# Patient Record
Sex: Male | Born: 1970 | Race: White | Hispanic: No | Marital: Single | Smoking: Current every day smoker
Health system: Southern US, Community
[De-identification: ages and names within clinical notes are randomized; demographics above are authoritative.]

## PROBLEM LIST (undated history)

## (undated) DIAGNOSIS — Z720 Tobacco use: Secondary | ICD-10-CM

## (undated) DIAGNOSIS — N200 Calculus of kidney: Secondary | ICD-10-CM

## (undated) DIAGNOSIS — G43909 Migraine, unspecified, not intractable, without status migrainosus: Secondary | ICD-10-CM

## (undated) HISTORY — DX: Migraine, unspecified, not intractable, without status migrainosus: G43.909

## (undated) HISTORY — DX: Tobacco use: Z72.0

---

## 1998-11-11 ENCOUNTER — Emergency Department (HOSPITAL_COMMUNITY): Admission: EM | Admit: 1998-11-11 | Discharge: 1998-11-11 | Payer: Self-pay | Admitting: Emergency Medicine

## 1998-11-11 ENCOUNTER — Encounter: Payer: Self-pay | Admitting: Emergency Medicine

## 1999-10-04 ENCOUNTER — Encounter: Payer: Self-pay | Admitting: Emergency Medicine

## 1999-10-04 ENCOUNTER — Emergency Department (HOSPITAL_COMMUNITY): Admission: EM | Admit: 1999-10-04 | Discharge: 1999-10-04 | Payer: Self-pay | Admitting: Emergency Medicine

## 2004-02-02 ENCOUNTER — Emergency Department (HOSPITAL_COMMUNITY): Admission: EM | Admit: 2004-02-02 | Discharge: 2004-02-02 | Payer: Self-pay | Admitting: Family Medicine

## 2004-06-08 ENCOUNTER — Emergency Department (HOSPITAL_COMMUNITY): Admission: AD | Admit: 2004-06-08 | Discharge: 2004-06-08 | Payer: Self-pay | Admitting: Family Medicine

## 2004-10-18 ENCOUNTER — Emergency Department (HOSPITAL_COMMUNITY): Admission: EM | Admit: 2004-10-18 | Discharge: 2004-10-18 | Payer: Self-pay | Admitting: Family Medicine

## 2004-10-19 ENCOUNTER — Emergency Department (HOSPITAL_COMMUNITY): Admission: EM | Admit: 2004-10-19 | Discharge: 2004-10-19 | Payer: Self-pay | Admitting: Family Medicine

## 2005-03-12 ENCOUNTER — Emergency Department (HOSPITAL_COMMUNITY): Admission: EM | Admit: 2005-03-12 | Discharge: 2005-03-12 | Payer: Self-pay | Admitting: Family Medicine

## 2005-12-06 ENCOUNTER — Emergency Department (HOSPITAL_COMMUNITY): Admission: EM | Admit: 2005-12-06 | Discharge: 2005-12-06 | Payer: Self-pay | Admitting: Family Medicine

## 2006-03-05 ENCOUNTER — Emergency Department (HOSPITAL_COMMUNITY): Admission: EM | Admit: 2006-03-05 | Discharge: 2006-03-05 | Payer: Self-pay | Admitting: Family Medicine

## 2006-03-28 IMAGING — CR DG FOOT COMPLETE 3+V*L*
3 series · 3 of 3 positions shown · non-contrast
Comparison: none

CLINICAL DATA: Plantar left foot pain for one week with no known injury.

LEFT FOOT - 3 VIEW

[view not recorded (1 of 3)]
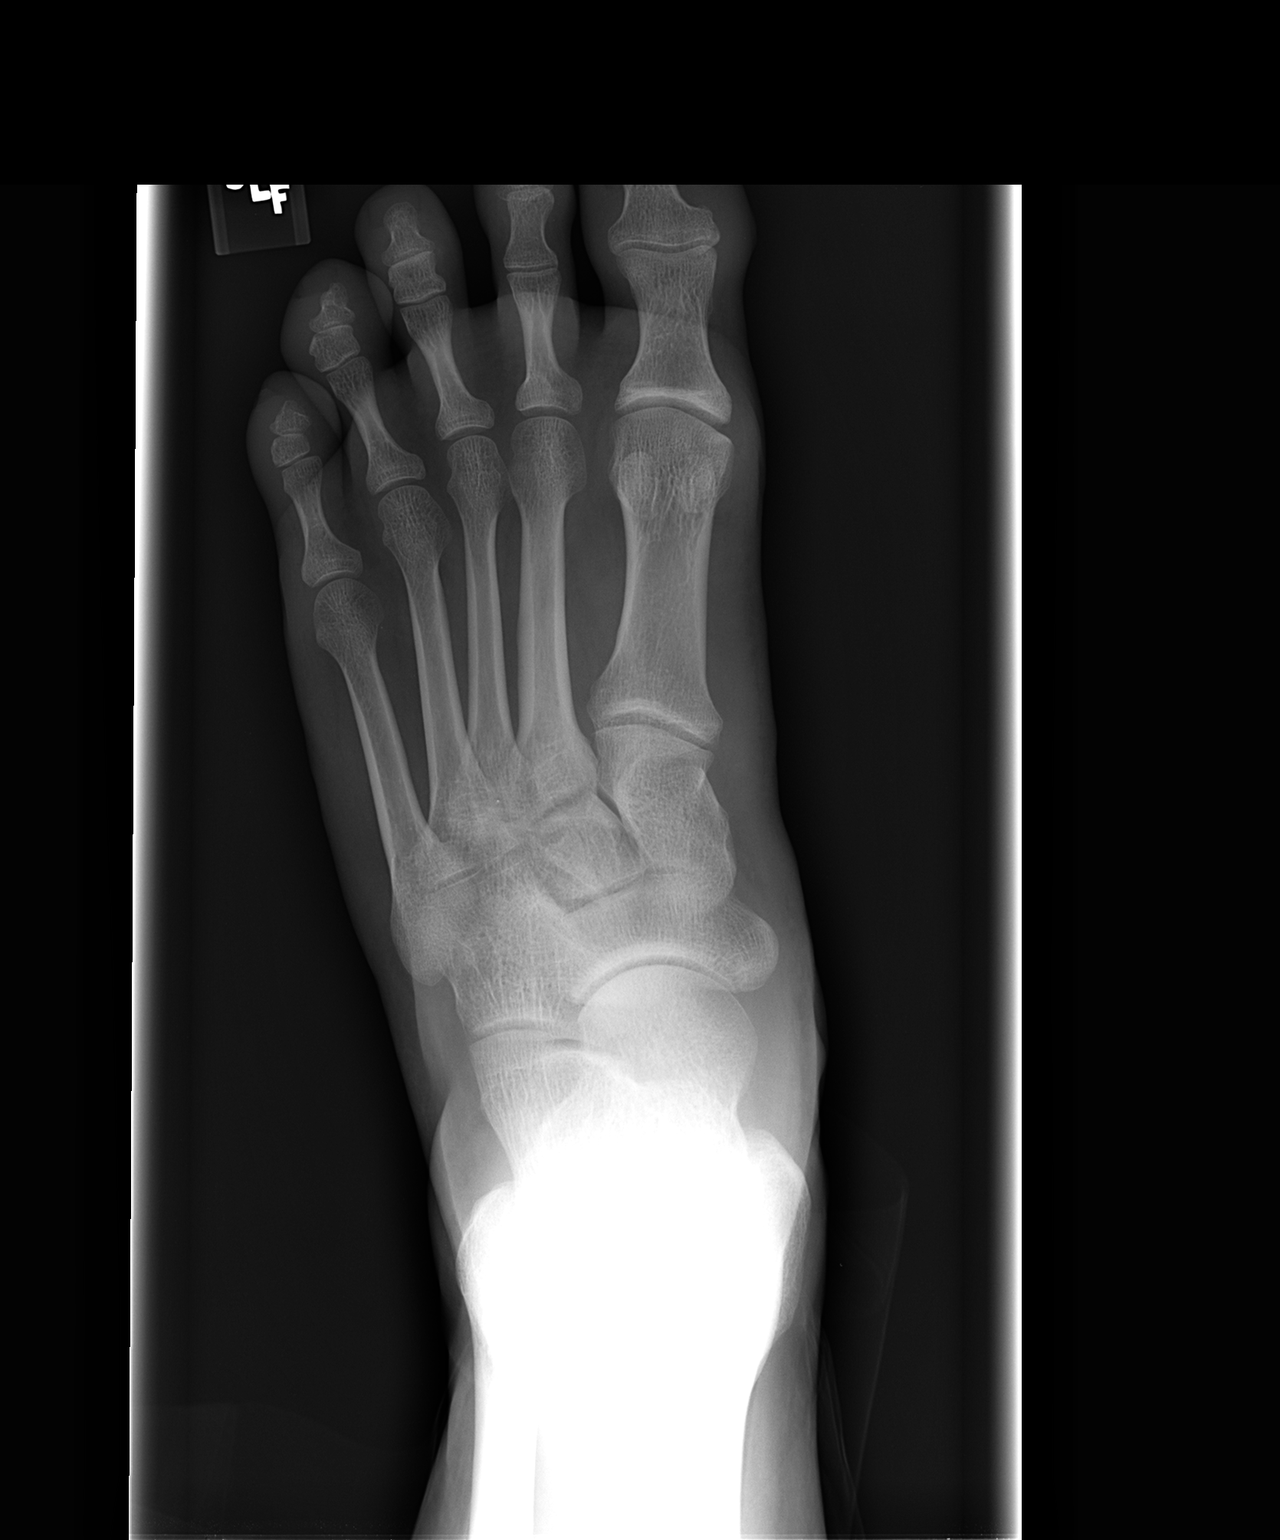

[view not recorded (2 of 3)]
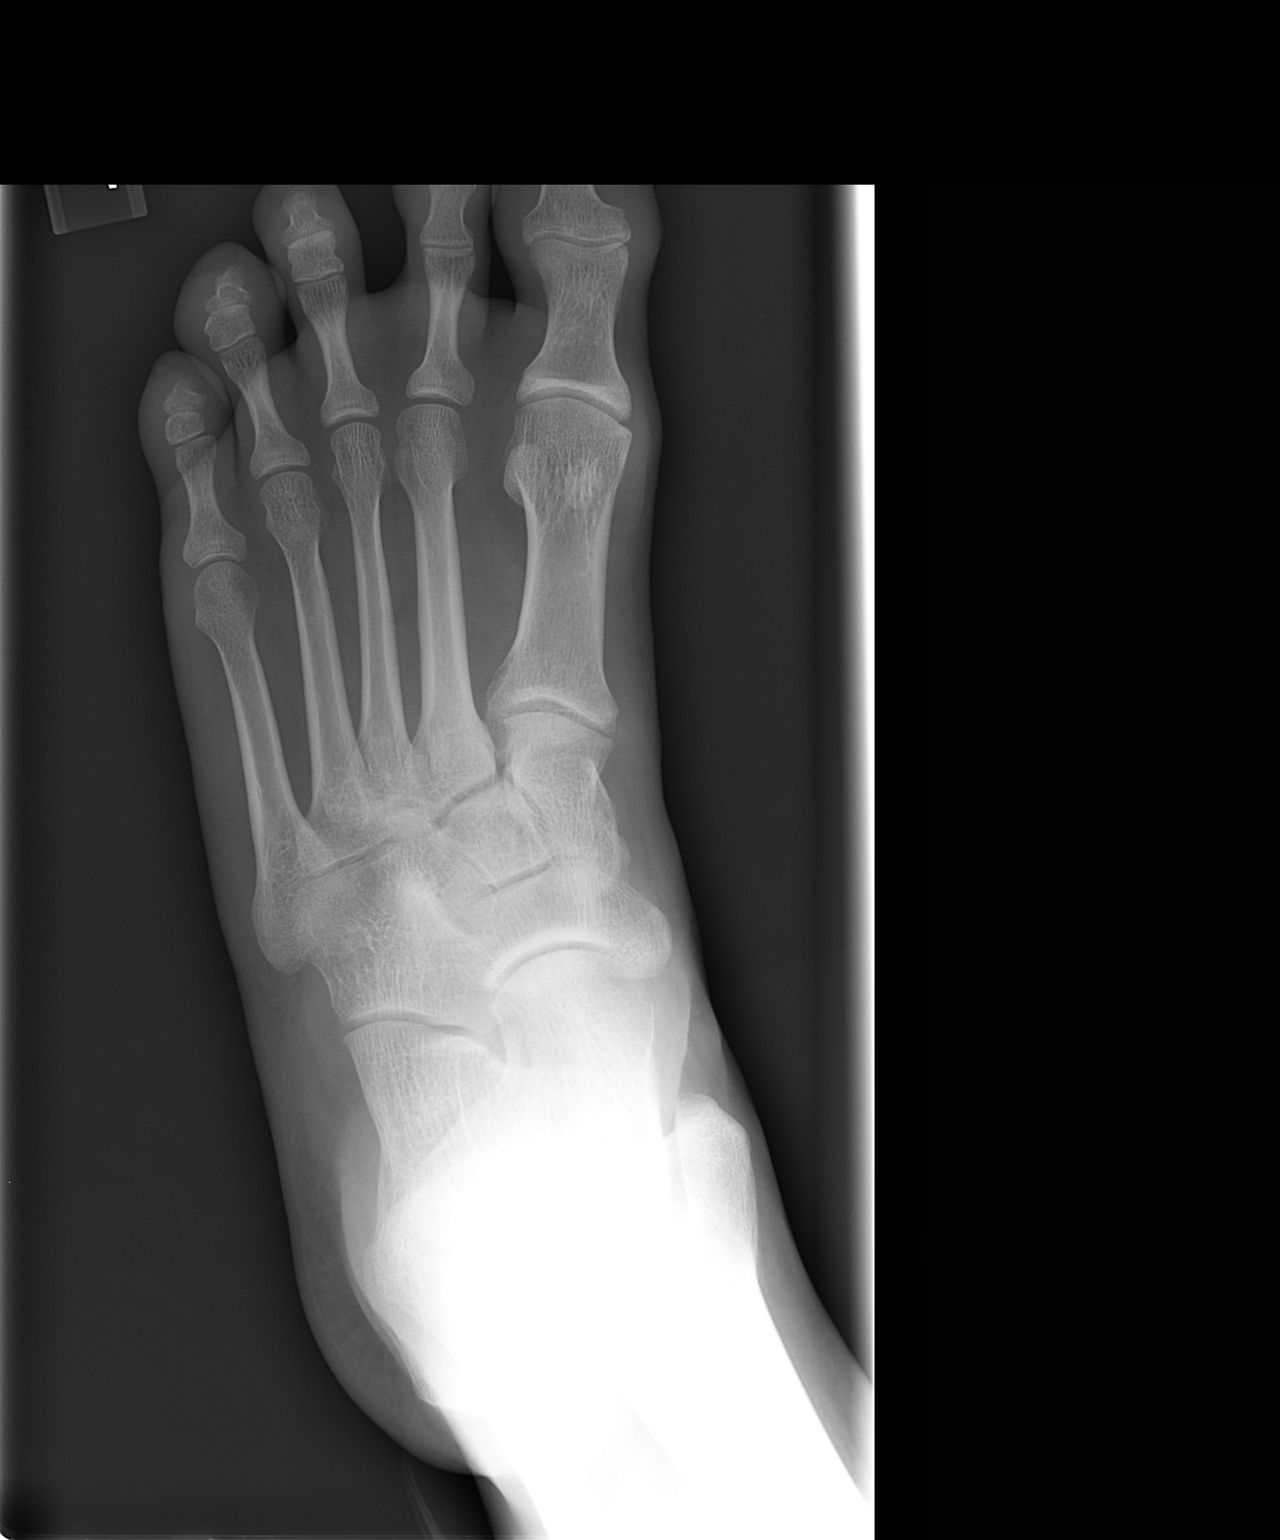

[view not recorded (3 of 3)]
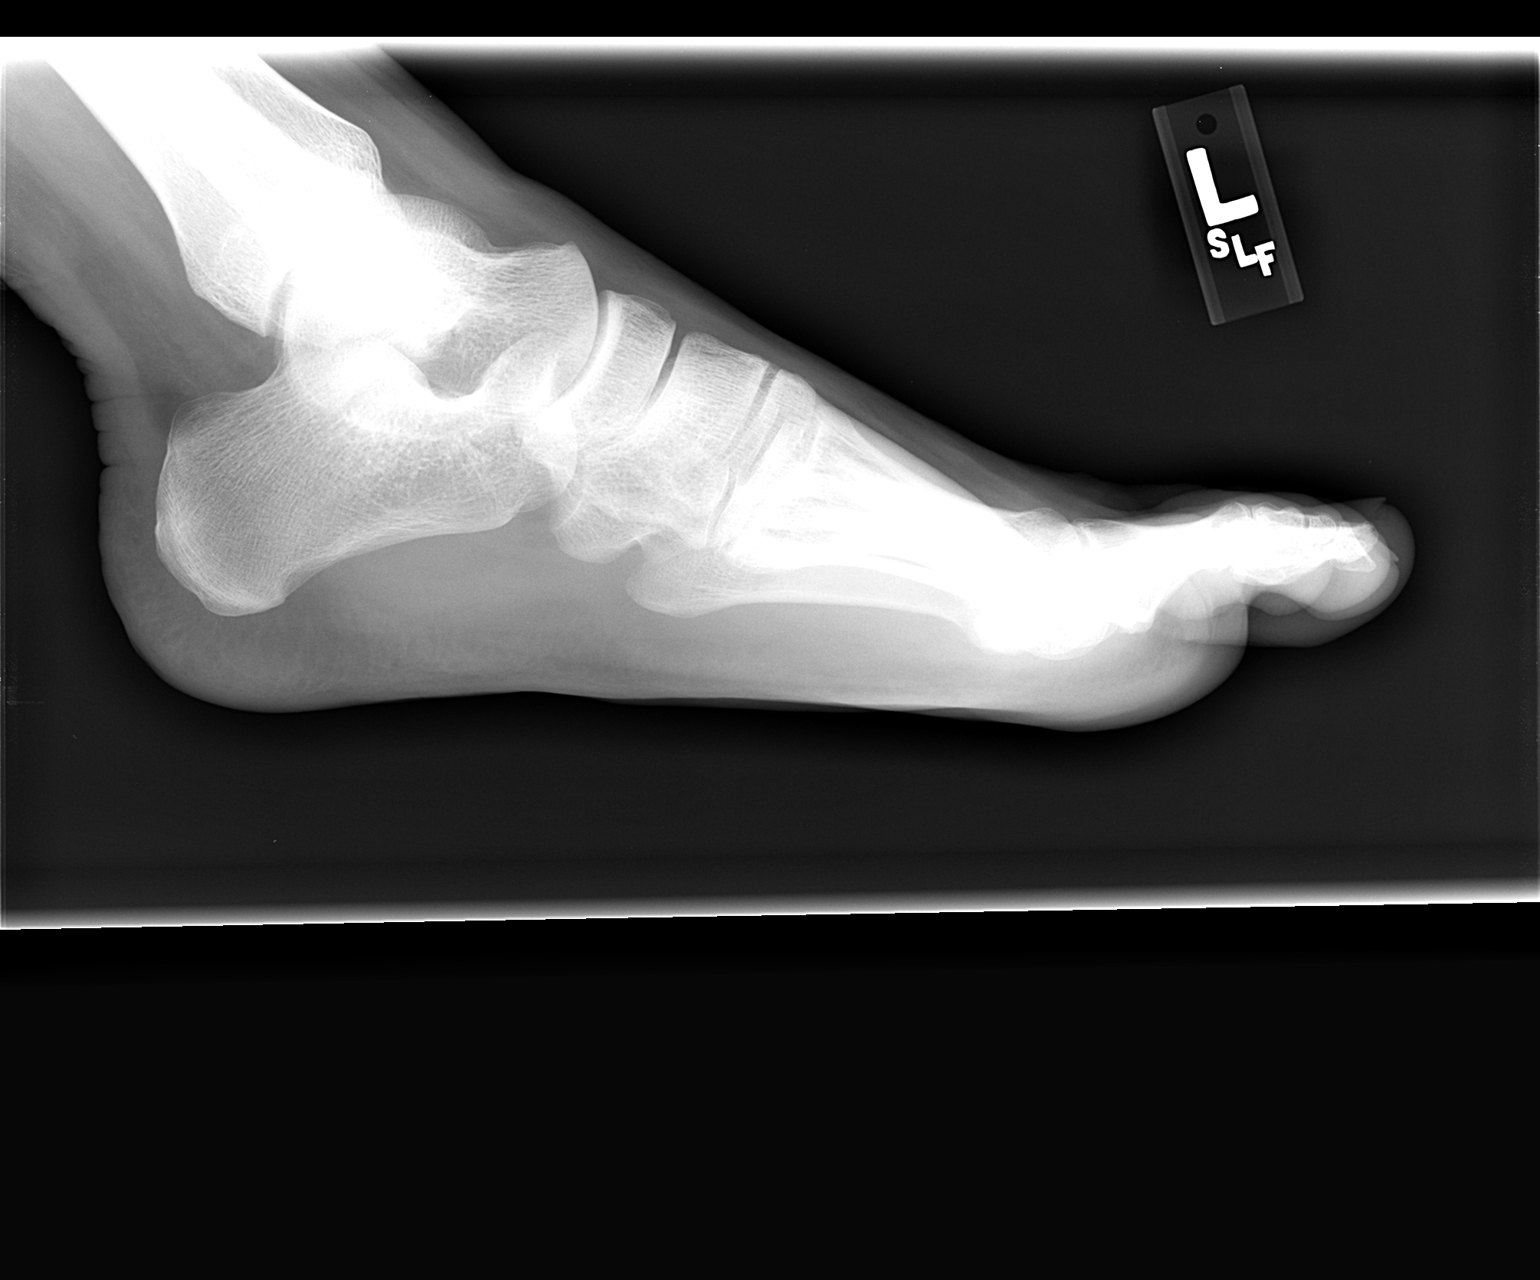

[3 of 3 positions shown; findings below may reference images not displayed]

FINDINGS: Normal appearing bones and soft tissues.

IMPRESSION

Normal examination.

## 2006-06-09 ENCOUNTER — Encounter: Payer: Self-pay | Admitting: Internal Medicine

## 2006-06-09 ENCOUNTER — Ambulatory Visit: Payer: Self-pay | Admitting: Internal Medicine

## 2006-06-09 DIAGNOSIS — L0293 Carbuncle, unspecified: Secondary | ICD-10-CM

## 2006-06-09 DIAGNOSIS — I10 Essential (primary) hypertension: Secondary | ICD-10-CM | POA: Insufficient documentation

## 2006-06-09 DIAGNOSIS — L0292 Furuncle, unspecified: Secondary | ICD-10-CM | POA: Insufficient documentation

## 2006-06-09 DIAGNOSIS — J45909 Unspecified asthma, uncomplicated: Secondary | ICD-10-CM | POA: Insufficient documentation

## 2006-07-21 ENCOUNTER — Encounter: Payer: Self-pay | Admitting: Internal Medicine

## 2006-07-21 ENCOUNTER — Ambulatory Visit: Payer: Self-pay | Admitting: Internal Medicine

## 2006-07-21 DIAGNOSIS — G2581 Restless legs syndrome: Secondary | ICD-10-CM | POA: Insufficient documentation

## 2006-08-12 ENCOUNTER — Emergency Department (HOSPITAL_COMMUNITY): Admission: EM | Admit: 2006-08-12 | Discharge: 2006-08-12 | Payer: Self-pay | Admitting: Family Medicine

## 2007-03-02 ENCOUNTER — Emergency Department (HOSPITAL_COMMUNITY): Admission: EM | Admit: 2007-03-02 | Discharge: 2007-03-02 | Payer: Self-pay | Admitting: Emergency Medicine

## 2007-12-16 ENCOUNTER — Emergency Department (HOSPITAL_COMMUNITY): Admission: EM | Admit: 2007-12-16 | Discharge: 2007-12-16 | Payer: Self-pay | Admitting: Family Medicine

## 2007-12-23 IMAGING — CR DG ABDOMEN 1V
2 series · 2 of 2 positions shown · non-contrast
Comparison: none

CLINICAL DATA: Left flank pain for a day.
 ABDOMEN, ONE VIEW:

[view not recorded (1 of 2)]
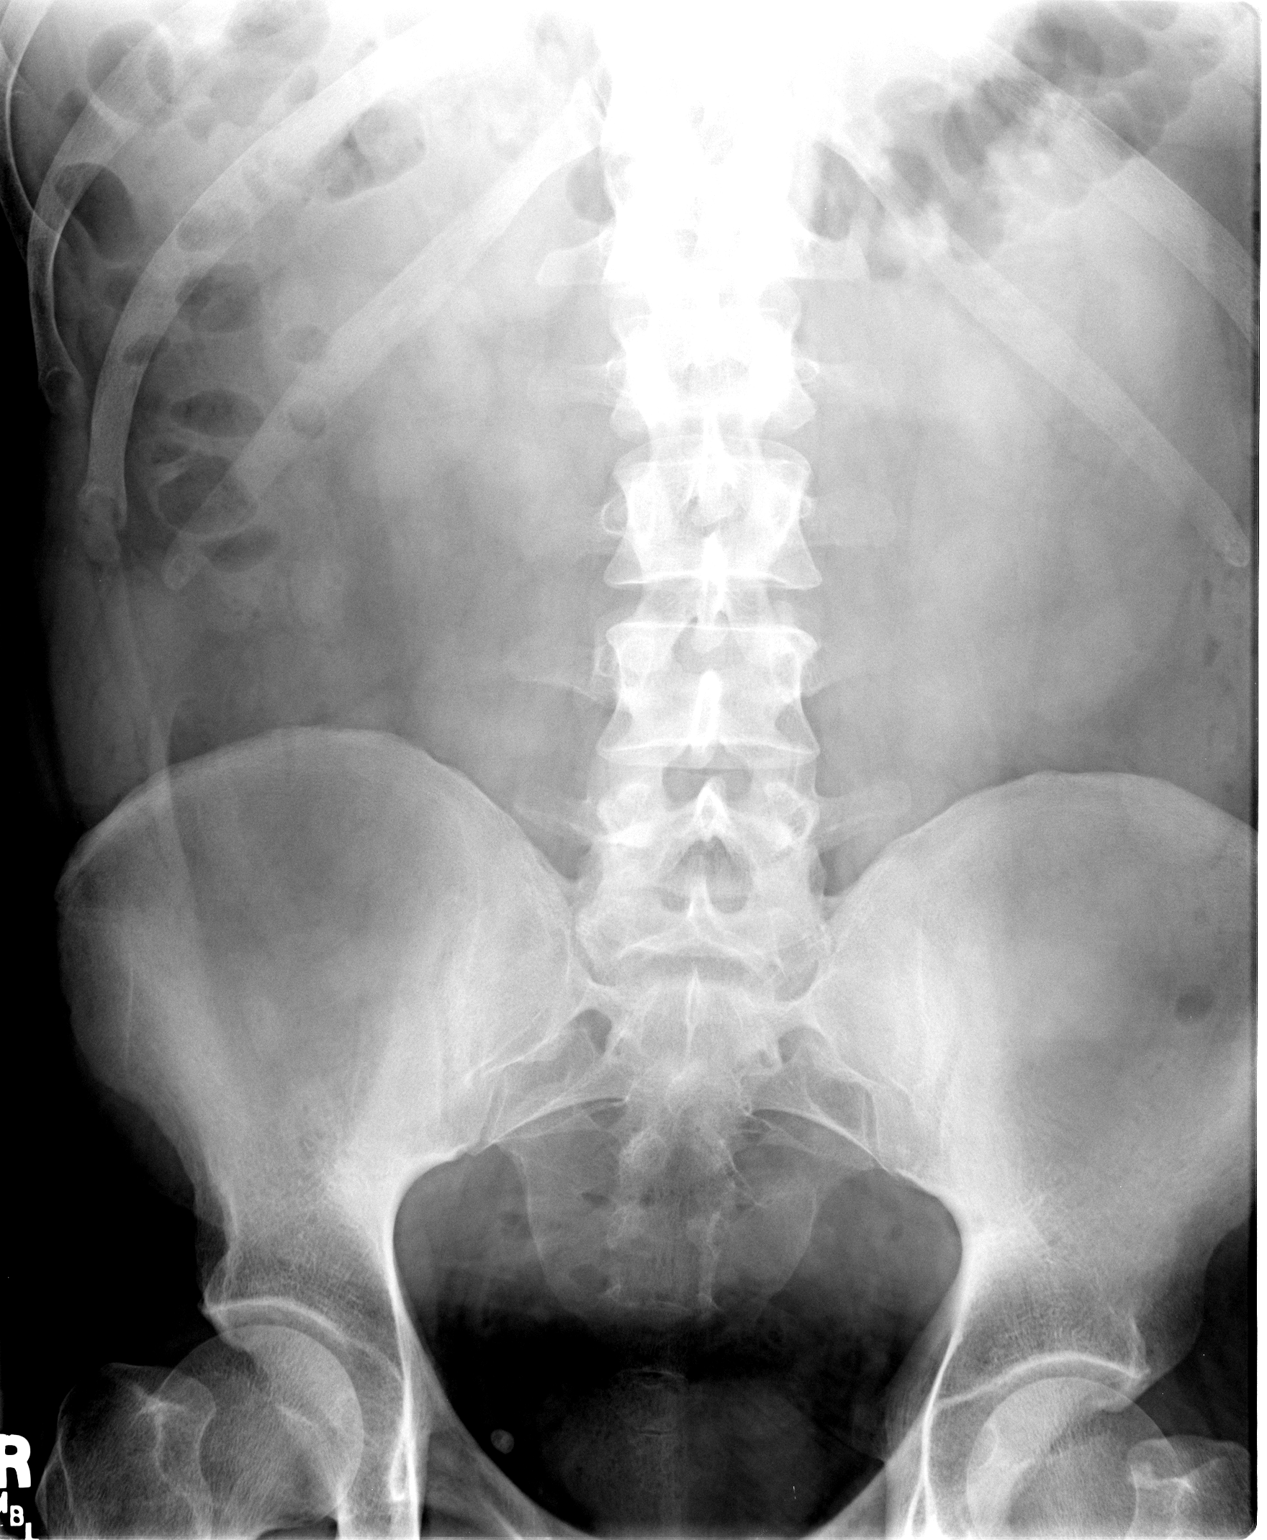

[view not recorded (2 of 2)]
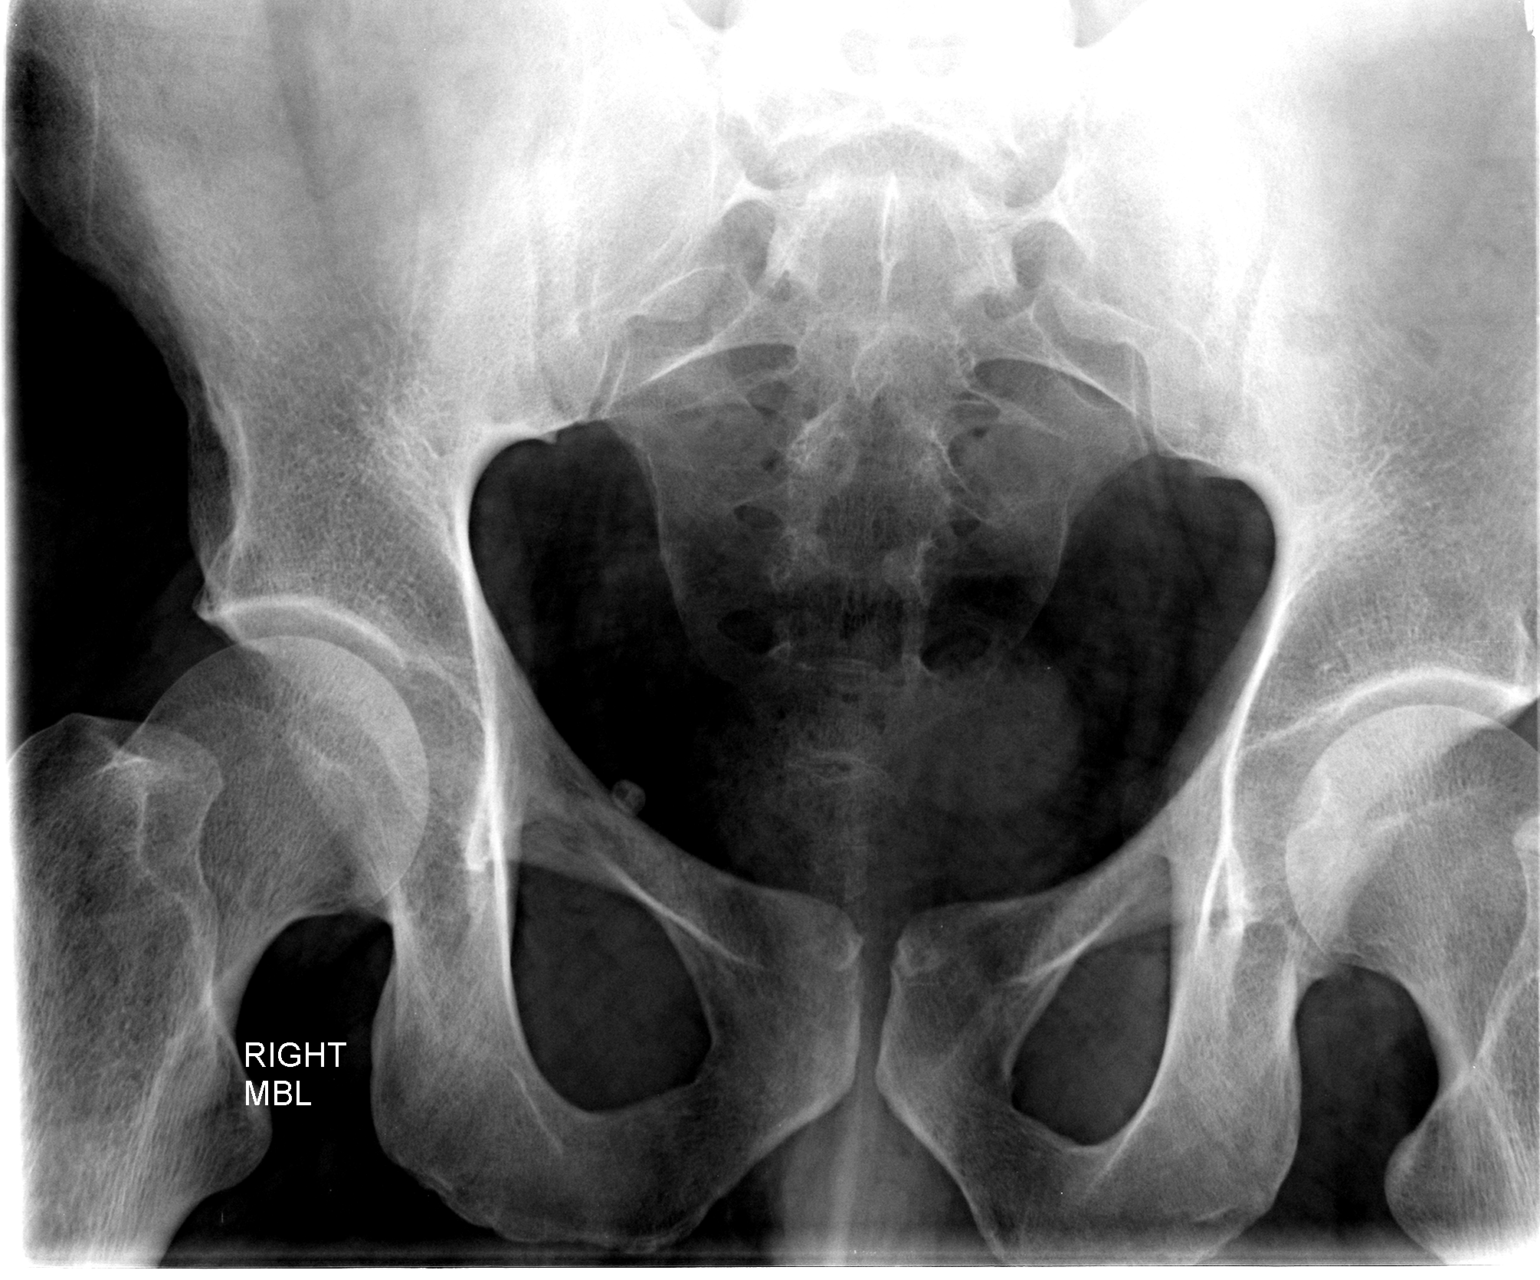

[2 of 2 positions shown; findings below may reference images not displayed]

FINDINGS: A supine film of the abdomen shows a nonspecific bowel gas pattern.  No opaque calculi are seen overlying the renal shadows or the expected courses of the ureters.  Calcified phlebolith is noted in the right lower pelvis.
IMPRESSION: Nonspecific bowel gas pattern.  No opaque calculi noted.

## 2008-03-21 ENCOUNTER — Emergency Department (HOSPITAL_COMMUNITY): Admission: EM | Admit: 2008-03-21 | Discharge: 2008-03-21 | Payer: Self-pay | Admitting: Family Medicine

## 2008-04-04 ENCOUNTER — Emergency Department (HOSPITAL_COMMUNITY): Admission: EM | Admit: 2008-04-04 | Discharge: 2008-04-04 | Payer: Self-pay | Admitting: Family Medicine

## 2008-08-29 ENCOUNTER — Emergency Department (HOSPITAL_COMMUNITY): Admission: EM | Admit: 2008-08-29 | Discharge: 2008-08-29 | Payer: Self-pay | Admitting: Emergency Medicine

## 2009-06-10 ENCOUNTER — Emergency Department (HOSPITAL_COMMUNITY): Admission: EM | Admit: 2009-06-10 | Discharge: 2009-06-10 | Payer: Self-pay | Admitting: Emergency Medicine

## 2009-12-08 ENCOUNTER — Emergency Department (HOSPITAL_COMMUNITY): Admission: EM | Admit: 2009-12-08 | Discharge: 2009-12-08 | Payer: Self-pay | Admitting: Emergency Medicine

## 2010-04-26 ENCOUNTER — Emergency Department (HOSPITAL_COMMUNITY)
Admission: EM | Admit: 2010-04-26 | Discharge: 2010-04-26 | Payer: Self-pay | Source: Home / Self Care | Admitting: Family Medicine

## 2010-04-28 ENCOUNTER — Emergency Department (HOSPITAL_COMMUNITY)
Admission: EM | Admit: 2010-04-28 | Discharge: 2010-04-28 | Payer: Self-pay | Source: Home / Self Care | Admitting: Emergency Medicine

## 2010-04-30 ENCOUNTER — Emergency Department (HOSPITAL_COMMUNITY)
Admission: EM | Admit: 2010-04-30 | Discharge: 2010-04-30 | Payer: Self-pay | Source: Home / Self Care | Admitting: Family Medicine

## 2010-04-30 LAB — CULTURE, ROUTINE-ABSCESS

## 2010-07-23 ENCOUNTER — Inpatient Hospital Stay (INDEPENDENT_AMBULATORY_CARE_PROVIDER_SITE_OTHER)
Admission: RE | Admit: 2010-07-23 | Discharge: 2010-07-23 | Disposition: A | Discharge status: Home / Self Care | Payer: Self-pay | Source: Ambulatory Visit | Attending: Family Medicine | Admitting: Family Medicine

## 2010-07-23 DIAGNOSIS — L02419 Cutaneous abscess of limb, unspecified: Secondary | ICD-10-CM

## 2010-07-26 ENCOUNTER — Inpatient Hospital Stay (INDEPENDENT_AMBULATORY_CARE_PROVIDER_SITE_OTHER)
Admission: RE | Admit: 2010-07-26 | Discharge: 2010-07-26 | Disposition: A | Discharge status: Home / Self Care | Payer: Self-pay | Source: Ambulatory Visit | Attending: Emergency Medicine | Admitting: Emergency Medicine

## 2010-07-26 DIAGNOSIS — L03119 Cellulitis of unspecified part of limb: Secondary | ICD-10-CM

## 2010-12-31 LAB — POCT URINALYSIS DIP (DEVICE)
Glucose, UA: NEGATIVE
Hgb urine dipstick: NEGATIVE
Nitrite: NEGATIVE
Operator id: 239701
Protein, ur: NEGATIVE
Specific Gravity, Urine: 1.02
Urobilinogen, UA: 0.2

## 2011-01-07 LAB — URINE MICROSCOPIC-ADD ON

## 2011-01-07 LAB — COMPREHENSIVE METABOLIC PANEL
ALT: 39
Alkaline Phosphatase: 74
BUN: 22
CO2: 24
Chloride: 100
GFR calc non Af Amer: 60 — ABNORMAL LOW
Glucose, Bld: 142 — ABNORMAL HIGH
Potassium: 4.9
Sodium: 137
Total Bilirubin: 0.8
Total Protein: 6.9

## 2011-01-07 LAB — CBC
HCT: 46.8
Hemoglobin: 16
RBC: 5.6
RDW: 13.5
WBC: 16.5 — ABNORMAL HIGH

## 2011-01-07 LAB — URINALYSIS, ROUTINE W REFLEX MICROSCOPIC
Glucose, UA: NEGATIVE
Ketones, ur: NEGATIVE
Protein, ur: NEGATIVE
Urobilinogen, UA: 0.2

## 2011-01-07 LAB — DIFFERENTIAL
Basophils Absolute: 0
Basophils Relative: 0
Eosinophils Absolute: 0 — ABNORMAL LOW
Monocytes Relative: 4
Neutro Abs: 15.5 — ABNORMAL HIGH
Neutrophils Relative %: 94 — ABNORMAL HIGH

## 2011-01-29 ENCOUNTER — Inpatient Hospital Stay (INDEPENDENT_AMBULATORY_CARE_PROVIDER_SITE_OTHER)
Admission: RE | Admit: 2011-01-29 | Discharge: 2011-01-29 | Disposition: A | Discharge status: Home / Self Care | Payer: Self-pay | Source: Ambulatory Visit | Attending: Family Medicine | Admitting: Family Medicine

## 2011-01-29 DIAGNOSIS — R209 Unspecified disturbances of skin sensation: Secondary | ICD-10-CM

## 2011-01-29 DIAGNOSIS — M25519 Pain in unspecified shoulder: Secondary | ICD-10-CM

## 2011-07-10 ENCOUNTER — Encounter (HOSPITAL_COMMUNITY): Payer: Self-pay | Admitting: *Deleted

## 2011-07-10 ENCOUNTER — Emergency Department (INDEPENDENT_AMBULATORY_CARE_PROVIDER_SITE_OTHER)
Admission: EM | Admit: 2011-07-10 | Discharge: 2011-07-10 | Disposition: A | Discharge status: Home / Self Care | Payer: BC Managed Care – PPO | Source: Home / Self Care | Attending: Emergency Medicine | Admitting: Emergency Medicine

## 2011-07-10 DIAGNOSIS — R109 Unspecified abdominal pain: Secondary | ICD-10-CM

## 2011-07-10 HISTORY — DX: Calculus of kidney: N20.0

## 2011-07-10 LAB — POCT URINALYSIS DIP (DEVICE)
Bilirubin Urine: NEGATIVE
Glucose, UA: NEGATIVE mg/dL
Leukocytes, UA: NEGATIVE
Nitrite: NEGATIVE
Urobilinogen, UA: 0.2 mg/dL (ref 0.0–1.0)

## 2011-07-10 MED ORDER — OXYCODONE-ACETAMINOPHEN 5-325 MG PO TABS: 1.0000 | ORAL_TABLET | Freq: Four times a day (QID) | ORAL | Status: AC | PRN

## 2011-07-10 MED ORDER — KETOROLAC TROMETHAMINE 30 MG/ML IJ SOLN
INTRAMUSCULAR | Status: AC
  Filled 2011-07-10: qty 1

## 2011-07-10 MED ORDER — ONDANSETRON HCL 4 MG PO TABS: 4.0000 mg | ORAL_TABLET | Freq: Three times a day (TID) | ORAL | Status: AC | PRN

## 2011-07-10 MED ORDER — KETOROLAC TROMETHAMINE 30 MG/ML IJ SOLN
30.0000 mg | Freq: Once | INTRAMUSCULAR | Status: AC
  Administered 2011-07-10: 30 mg via INTRAMUSCULAR

## 2011-07-10 NOTE — Discharge Instructions (Signed)
Has discuss if any vomiting, increased flank pain, any fevers of 100.4 above no improvement within the next 48 hours should go to the [facility] emergency department for further evaluation in the meantime observe your urine provided device. And drink abundant fluids approximately 2 L within the next 12-24 hours. We discussed your exam, urine test results and my diagnostic impression that potentially you are experiencing and the passing of a ureteral stone, most of these passed spontaneously within the next one to 2 weeks.

## 2011-07-10 NOTE — ED Notes (Signed)
Pt feeling much better after voiding - ed called to inform would not be transfering - strainer given to pt

## 2011-07-10 NOTE — ED Notes (Addendum)
Pt with onset of right flank pain 4am this morning has not voided since 11pm last night - n/v - skin pale - per pt pain improved with hot shower - pain worse with movement - kidney stone approx 10 years ago

## 2011-07-10 NOTE — ED Notes (Signed)
Pt to bathroom voided rates pain a 5  Report called to [facility] r[facility]   Per dr Ladon Applebaum will reaccess

## 2011-07-10 NOTE — ED Provider Notes (Signed)
History     CSN: [id number]  Arrival date & time 07/10/11  4098   First MD Initiated Contact with Patient 07/10/11 0825      Chief Complaint  Patient presents with  . Flank Pain  . Urinary Retention    (Consider location/radiation/quality/duration/timing/severity/associated sxs/prior treatment) HPI Comments: Patient presented to [facility] complaining of sudden R flank painand not urinating, feeling like it a stone, had them before. No fevers. Feeling nauseous, but no vomiting  Patient is a 41 y.o. male presenting with flank pain. The history is provided by the patient.  Flank Pain This is a new problem. The current episode started 12 to 24 hours ago. The problem has not changed since onset.Pertinent negatives include no abdominal pain and no shortness of breath. The symptoms are aggravated by twisting and bending. The symptoms are relieved by nothing. He has tried nothing for the symptoms.    Past Medical History  Diagnosis Date  . Kidney calculi   . Kidney calculi     History reviewed. No pertinent past surgical history.  History reviewed. No pertinent family history.  History  Substance Use Topics  . Smoking status: Current Everyday Smoker  . Smokeless tobacco: Not on file  . Alcohol Use: Yes      Review of Systems  Constitutional: Positive for diaphoresis, activity change and appetite change. Negative for fever.  Respiratory: Negative for shortness of breath.   Gastrointestinal: Negative for abdominal pain.  Genitourinary: Positive for flank pain and difficulty urinating. Negative for urgency, penile swelling, scrotal swelling, penile pain and testicular pain.    Allergies  Review of patient's allergies indicates no known allergies.  Home Medications   Current Outpatient Rx  Name Route Sig Dispense Refill  . ONDANSETRON HCL 4 MG PO TABS Oral Take 1 tablet (4 mg total) by mouth every 8 (eight) hours as needed for nausea. 12 tablet 0  . OXYCODONE-ACETAMINOPHEN  5-325 MG PO TABS Oral Take 1 tablet by mouth every 6 (six) hours as needed for pain. 10 tablet 0    BP 129/89  Pulse 74  Temp(Src) 98.4 F (36.9 C) (Oral)  Resp 20  SpO2 95%  Physical Exam  Nursing note and vitals reviewed. Constitutional: He appears well-developed and well-nourished.  HENT:  Head: Normocephalic.  Eyes: Conjunctivae are normal.  Neck: Neck supple.  Pulmonary/Chest: No respiratory distress. He has no wheezes.  Abdominal: Soft. There is no tenderness. There is no rebound, no guarding and no CVA tenderness.  Lymphadenopathy:    He has no cervical adenopathy.  Skin: No rash noted.    ED Course  Procedures (including critical care time)  Labs Reviewed  POCT URINALYSIS DIP (DEVICE) - Abnormal; Notable for the following:    Hgb urine dipstick MODERATE (*)    Protein, ur 30 (*)    All other components within normal limits   No results found.   1. Flank pain, acute   2. Hematuria       MDM  R flank pain sudden with hematuria.Patient with marked improvement after analgesia at [facility], explained dthat if worsening symptoms to go to the [facility] ED,  For further evaluation if worsening, fevers or unable to urinate.        Jimmie Molly, MD 07/10/11 2133

## 2011-10-01 ENCOUNTER — Emergency Department (HOSPITAL_COMMUNITY)
Admission: EM | Admit: 2011-10-01 | Discharge: 2011-10-01 | Disposition: A | Discharge status: Home / Self Care | Payer: BC Managed Care – PPO | Source: Home / Self Care

## 2011-10-01 ENCOUNTER — Encounter (HOSPITAL_COMMUNITY): Payer: Self-pay

## 2011-10-01 DIAGNOSIS — R52 Pain, unspecified: Secondary | ICD-10-CM

## 2011-10-01 DIAGNOSIS — L03317 Cellulitis of buttock: Secondary | ICD-10-CM

## 2011-10-01 DIAGNOSIS — L0231 Cutaneous abscess of buttock: Secondary | ICD-10-CM

## 2011-10-01 MED ORDER — TRAMADOL HCL 50 MG PO TABS
50.0000 mg | ORAL_TABLET | Freq: Four times a day (QID) | ORAL | Status: AC | PRN
Start: 2011-10-01 — End: 2011-10-11

## 2011-10-01 MED ORDER — SULFAMETHOXAZOLE-TRIMETHOPRIM 800-160 MG PO TABS: 1.0000 | ORAL_TABLET | Freq: Two times a day (BID) | ORAL | Status: AC

## 2011-10-01 NOTE — ED Notes (Signed)
C/o boil to lt buttock for 1 week.  States using warm compresses but has not drained.

## 2011-10-01 NOTE — ED Notes (Signed)
I&D in process

## 2011-10-01 NOTE — Discharge Instructions (Signed)
Abscess An abscess (boil or furuncle) is an infected area that contains a collection of pus.  SYMPTOMS Signs and symptoms of an abscess include pain, tenderness, redness, or hardness. You may feel a moveable soft area under your skin. An abscess can occur anywhere in the body.  TREATMENT  A surgical cut (incision) may be made over your abscess to drain the pus. Gauze may be packed into the space or a drain may be looped through the abscess cavity (pocket). This provides a drain that will allow the cavity to heal from the inside outwards. The abscess may be painful for a few days, but should feel much better if it was drained.  Your abscess, if seen early, may not have localized and may not have been drained. If not, another appointment may be required if it does not get better on its own or with medications. HOME CARE INSTRUCTIONS   Only take over-the-counter or prescription medicines for pain, discomfort, or fever as directed by your caregiver.   Take your antibiotics as directed if they were prescribed. Finish them even if you start to feel better.   Keep the skin and clothes clean around your abscess.   If the abscess was drained, you will need to use gauze dressing to collect any draining pus. Dressings will typically need to be changed 3 or more times a day.   The infection may spread by skin contact with others. Avoid skin contact as much as possible.   Practice good hygiene. This includes regular hand washing, cover any draining skin lesions, and do not share personal care items.   If you participate in sports, do not share athletic equipment, towels, whirlpools, or personal care items. Shower after every practice or tournament.   If a draining area cannot be adequately covered:   Do not participate in sports.   Children should not participate in day care until the wound has healed or drainage stops.   If your caregiver has given you a follow-up appointment, it is very important  to keep that appointment. Not keeping the appointment could result in a much worse infection, chronic or permanent injury, pain, and disability. If there is any problem keeping the appointment, you must call back to this facility for assistance.  SEEK MEDICAL CARE IF:   You develop increased pain, swelling, redness, drainage, or bleeding in the wound site.   You develop signs of generalized infection including muscle aches, chills, fever, or a general ill feeling.   You have an oral temperature above 102 F (38.9 C).  MAKE SURE YOU:   Understand these instructions.   Will watch your condition.   Will get help right away if you are not doing well or get worse.  Document Released: 12/26/2004 Document Revised: 03/07/2011 Document Reviewed: 10/20/2007 ExitCare Patient Information 2012 [employer].Abscess Care After An abscess (also called a boil or furuncle) is an infected area that contains a collection of pus. Signs and symptoms of an abscess include pain, tenderness, redness, or hardness, or you may feel a moveable soft area under your skin. An abscess can occur anywhere in the body. The infection may spread to surrounding tissues causing cellulitis. A cut (incision) by the surgeon was made over your abscess and the pus was drained out. Gauze may have been packed into the space to provide a drain that will allow the cavity to heal from the inside outwards. The boil may be painful for 5 to 7 days. Most people with a boil   do not have high fevers. Your abscess, if seen early, may not have localized, and may not have been lanced. If not, another appointment may be required for this if it does not get better on its own or with medications. HOME CARE INSTRUCTIONS   Only take over-the-counter or prescription medicines for pain, discomfort, or fever as directed by your caregiver.   When you bathe, soak and then remove gauze or iodoform packs at least daily or as directed by your caregiver. You may  then wash the wound gently with mild soapy water. Repack with gauze or do as your caregiver directs.  SEEK IMMEDIATE MEDICAL CARE IF:   You develop increased pain, swelling, redness, drainage, or bleeding in the wound site.   You develop signs of generalized infection including muscle aches, chills, fever, or a general ill feeling.   An oral temperature above 102 F (38.9 C) develops, not controlled by medication.  See your caregiver for a recheck if you develop any of the symptoms described above. If medications (antibiotics) were prescribed, take them as directed. Document Released: 10/04/2004 Document Revised: 03/07/2011 Document Reviewed: 06/01/2007 Exit[employer] Patient Information 2012 [employer]. 

## 2011-10-01 NOTE — ED Provider Notes (Signed)
History     CSN: [id number]  Arrival date & time 10/01/11  1101   None     Chief Complaint  Patient presents with  . Recurrent Skin Infections    (Consider location/radiation/quality/duration/timing/severity/associated sxs/prior treatment) The history is provided by the patient.   This patient presents for evaluation of a cutaneous abscess.  The lesion is located in the left buttocks.  Onset was 1 week ago.  Symptoms have worsen.  Abscess has associated symptoms of tenderness.  The patient does have previous history of cutaneous abscesses, states last one year ago on lower extremity.  There is no known previous history of MRSA.   They do not have a history of diabetes. Warm compresses have not improved area. Past Medical History  Diagnosis Date  . Kidney calculi   . Kidney calculi     History reviewed. No pertinent past surgical history.  No family history on file.  History  Substance Use Topics  . Smoking status: Current Everyday Smoker  . Smokeless tobacco: Not on file  . Alcohol Use: No      Review of Systems  All other systems reviewed and are negative.    Allergies  Review of patient's allergies indicates no known allergies.  Home Medications   Current Outpatient Rx  Name Route Sig Dispense Refill  . SULFAMETHOXAZOLE-TRIMETHOPRIM 800-160 MG PO TABS Oral Take 1 tablet by mouth every 12 (twelve) hours. 20 tablet 0  . TRAMADOL HCL 50 MG PO TABS Oral Take 1 tablet (50 mg total) by mouth every 6 (six) hours as needed for pain. 15 tablet 0    BP 122/79  Pulse 99  Temp 97.9 F (36.6 C) (Oral)  Resp 14  SpO2 99%  Physical Exam  Nursing note and vitals reviewed. Constitutional: He is oriented to person, place, and time. Vital signs are normal. He appears well-developed and well-nourished. He is active and cooperative.  HENT:  Head: Normocephalic.  Eyes: Conjunctivae are normal. Pupils are equal, round, and reactive to light. No scleral icterus.    Neck: Trachea normal. Neck supple.  Cardiovascular: Normal rate, regular rhythm and normal pulses.   Pulmonary/Chest: Effort normal. He has rales.  Neurological: He is alert and oriented to person, place, and time. No cranial nerve deficit or sensory deficit.  Skin: Skin is warm and dry. Lesion noted.     Psychiatric: He has a normal mood and affect. His speech is normal and behavior is normal. Judgment and thought content normal. Cognition and memory are normal.    ED Course  INCISION AND DRAINAGE Date/Time: 10/01/2011 11:47 AM Performed by: Nigel Mormon Quatisha Zylka L Authorized by: Johnsie Kindred Consent: Verbal consent obtained. Written consent not obtained. Risks and benefits: risks, benefits and alternatives were discussed Consent given by: patient Patient understanding: patient states understanding of the procedure being performed Patient consent: the patient's understanding of the procedure matches consent given Procedure consent: procedure consent matches procedure scheduled Site marked: the operative site was marked Patient identity confirmed: verbally with patient and arm band Time out: Immediately prior to procedure a "time out" was called to verify the correct patient, procedure, equipment, support staff and site/side marked as required. Type: abscess Location: left buttocks. Anesthesia: local infiltration Local anesthetic: lidocaine 2% without epinephrine Anesthetic total: 3 ml Patient sedated: no Scalpel size: 11 Needle gauge: 22 Incision type: single straight Complexity: simple Drainage: purulent and serosanguinous Drainage amount: moderate Wound treatment: wound left open Packing material: none Patient tolerance: Patient tolerated the procedure well  with no immediate complications.   (including critical care time)   Labs Reviewed  CULTURE, ROUTINE-ABSCESS   No results found.   1. Abscess of buttock, left   2. Pain       MDM  You have had an abscess  drained.  An abscess is a collection of pus caused by infection with skin bacteria such as Streptococcus or Staphylococcus.    For the first 2 days, leave the dressing in place and keep it clean and dry.  If the abscess was packed, we may instruct you to come back in 2 days to have the packing removed and maybe replaced.  If the abscess was not packed, you may remove the dressing yourself in 2 days and take care of the wound yourself.  Wear gloves, dispose of the soiled dressing material, and wash your hands before and after changing the dressing.  Wash the area well with soap and water, taking care to remove all the dried blood and drainage.  Fasten a dressing in place well with tape.  Continue to change the dressing until there is no further drainage.  Finish up the prescription of any antibiotics that you have been given.  Take infectious precautions since the bacteria that cause these abscesses may be contagious.  Wash hands frequently or use hand sanitizer, especially after touching the abscess area or changing dressings.  Do not allow anyone else to use your towel or washcloth and wash these items after each use until the abscess has healed.  You may want to use an antibacterial soap such as Dial or a prescription like Hibiclens.  You also may want to consider spraying the tub or shower with a disinfectant such a Lysol until the abscess has healed.  Things that should prompt you to return to the office for a recheck include:  Fever over 100 degrees, increasing pain or drainage, failure of the abscess to heal after 10 days, or other skin lesions elsewhere.         Johnsie Kindred, NP 10/01/11 1240

## 2011-10-02 NOTE — ED Provider Notes (Signed)
Medical screening examination/treatment/procedure(s) were performed by non-physician practitioner and as supervising physician I was immediately available for consultation/collaboration.  Leslee Home, M.D.   Reuben Likes, MD 10/02/11 1055

## 2011-10-04 LAB — CULTURE, ROUTINE-ABSCESS

## 2011-10-31 DIAGNOSIS — N200 Calculus of kidney: Secondary | ICD-10-CM

## 2011-10-31 HISTORY — DX: Calculus of kidney: N20.0

## 2011-12-18 ENCOUNTER — Ambulatory Visit (INDEPENDENT_AMBULATORY_CARE_PROVIDER_SITE_OTHER): Payer: BC Managed Care – PPO | Admitting: Internal Medicine

## 2011-12-18 ENCOUNTER — Encounter: Payer: Self-pay | Admitting: Internal Medicine

## 2011-12-18 VITALS — BP 136/108 | HR 72 | Temp 98.2°F | Ht 72.0 in | Wt 252.0 lb

## 2011-12-18 DIAGNOSIS — G2581 Restless legs syndrome: Secondary | ICD-10-CM

## 2011-12-18 DIAGNOSIS — D229 Melanocytic nevi, unspecified: Secondary | ICD-10-CM

## 2011-12-18 DIAGNOSIS — E669 Obesity, unspecified: Secondary | ICD-10-CM

## 2011-12-18 DIAGNOSIS — D239 Other benign neoplasm of skin, unspecified: Secondary | ICD-10-CM

## 2011-12-18 DIAGNOSIS — Z720 Tobacco use: Secondary | ICD-10-CM

## 2011-12-18 DIAGNOSIS — F17200 Nicotine dependence, unspecified, uncomplicated: Secondary | ICD-10-CM

## 2011-12-18 DIAGNOSIS — I10 Essential (primary) hypertension: Secondary | ICD-10-CM

## 2011-12-18 MED ORDER — ROTIGOTINE 2 MG/24HR TD PT24: 1.0000 | MEDICATED_PATCH | Freq: Every day | TRANSDERMAL | Status: DC

## 2011-12-18 NOTE — Assessment & Plan Note (Signed)
Patient with several irregular moles on his back.  Refer to dermatology for full skin exam.

## 2011-12-18 NOTE — Assessment & Plan Note (Signed)
I strongly encouraged tobacco cessation.  Use nicotine lozenges as directed.

## 2011-12-18 NOTE — Assessment & Plan Note (Signed)
Patient has chronic hx of RLS.  Rule out iron deficiency.  Good response to Neupro patch.  Use 2 mg patch daily.

## 2011-12-18 NOTE — Assessment & Plan Note (Signed)
Patient has mild diastolic hypertension.   Weight loss and regular exercise encouraged.  Monitor for now.  Obtain screening labs.  EKG is normal  (NSR at 71 bpm).

## 2011-12-18 NOTE — Patient Instructions (Addendum)
Please use nicotin lozenges as directed. (2 mg)

## 2011-12-18 NOTE — Progress Notes (Signed)
Subjective:    Patient ID: Todd Weiss, male    DOB: [birth year], 41 y.o.   MRN: [id number]  HPI  41 year old white male with history of tobacco use and restless leg syndrome to establish. She was previously seen by Dr. Cato Mulligan and 2008. Patient reports he has had chronic restless leg syndrome for many years. He reports symptoms are mainly nocturnal. He kicks his feet at night and his symptoms can frequently awaken patient from sleep. Patient's mother also noted to have restless leg syndrome and she has tried her Neupro patch with improvement in his symptoms.  Patient has had mildly elevated blood pressure readings in the past but is not on any antihypertensives. Today his diastolic blood pressure is slightly elevated. He has been struggling with obesity.  He also reports history of recent kidney stone. He passed a small stone approximately one month ago. He is not sure whether it is from his right left kidney. He was seen in urgent care. Stone analysis was not performed.  The patient is single.  He has never been married. He is currently unemployed. He was formally a Production designer, theatre/television/film. He is currently taking care of his elderly mother.  Review of Systems  Constitutional: Negative for activity change, appetite change and unexpected weight change.  Eyes: Negative for visual disturbance.  Respiratory: Intermittent cough and dyspnea with exertion. Cardiovascular: Negative for chest pain.  Genitourinary: Negative for difficulty urinating.  Neurological: Negative for headaches.  Gastrointestinal: Negative for abdominal pain, heartburn melena or hematochezia Psych: Negative for depression or anxiety He is interested in smoking cessation  Past Medical History  Diagnosis Date  . Migraine   . Kidney calculi 10/2011    small kidney stone.  Stone analysis not performed  . Tobacco abuse     History   Social History  . Marital Status: Single    Spouse Name: N/A    Number of Children:  N/A  . Years of Education: N/A   Occupational History  . Not on file.   Social History Main Topics  . Smoking status: Current Every Day Smoker  . Smokeless tobacco: Not on file  . Alcohol Use: No  . Drug Use: No  . Sexually Active:    Other Topics Concern  . Not on file   Social History Narrative  . No narrative on file    History reviewed. No pertinent past surgical history.  Family History  Problem Relation Age of Onset  . Diabetes Father   . Lymphoma Father     Non Hodgkins Lymphoma    No Known Allergies  Current Outpatient Prescriptions on File Prior to Visit  Medication Sig Dispense Refill  . rotigotine (NEUPRO) 2 MG/24HR Place 1 patch onto the skin daily.  30 patch  5    BP 136/108  Pulse 72  Temp 98.2 F (36.8 C) (Oral)  Ht 6' (1.829 m)  Wt 252 lb (114.306 kg)  BMI 34.18 kg/m2       Objective:   Physical Exam  Constitutional: He is oriented to person, place, and time.       Pleasant, obese 41 y/o male  HENT:  Head: Normocephalic and atraumatic.  Right Ear: External ear normal.  Left Ear: External ear normal.  Mouth/Throat: Oropharynx is clear and moist.       Hearing is grossly normal  Eyes: Conjunctivae normal and EOM are normal. Pupils are equal, round, and reactive to light.  Neck: Neck supple.  No carotid bruit  Cardiovascular: Normal rate, regular rhythm and normal heart sounds.   Pulmonary/Chest: Effort normal and breath sounds normal. He has no wheezes. He has no rales.  Abdominal: Soft. Bowel sounds are normal. He exhibits no mass.       obese  Genitourinary: Penis normal.       Normal testicular exam  Musculoskeletal: He exhibits no edema.  Lymphadenopathy:    He has no cervical adenopathy.  Neurological: He is alert and oriented to person, place, and time.  Skin: Skin is warm and dry.  Psychiatric: He has a normal mood and affect. His behavior is normal.          Assessment & Plan:

## 2012-01-15 ENCOUNTER — Ambulatory Visit (INDEPENDENT_AMBULATORY_CARE_PROVIDER_SITE_OTHER): Payer: BC Managed Care – PPO | Admitting: Internal Medicine

## 2012-01-15 ENCOUNTER — Encounter: Payer: Self-pay | Admitting: Internal Medicine

## 2012-01-15 VITALS — BP 114/78 | HR 72 | Temp 98.3°F | Resp 16 | Ht 73.0 in | Wt 248.0 lb

## 2012-01-15 DIAGNOSIS — R7309 Other abnormal glucose: Secondary | ICD-10-CM

## 2012-01-15 DIAGNOSIS — Z23 Encounter for immunization: Secondary | ICD-10-CM

## 2012-01-15 DIAGNOSIS — J069 Acute upper respiratory infection, unspecified: Secondary | ICD-10-CM

## 2012-01-15 DIAGNOSIS — G2581 Restless legs syndrome: Secondary | ICD-10-CM

## 2012-01-15 DIAGNOSIS — Z0000 Encounter for general adult medical examination without abnormal findings: Secondary | ICD-10-CM

## 2012-01-15 LAB — CBC WITH DIFFERENTIAL/PLATELET
Basophils Absolute: 0 10*3/uL (ref 0.0–0.1)
Eosinophils Absolute: 0.2 10*3/uL (ref 0.0–0.7)
HCT: 45.4 % (ref 39.0–52.0)
Hemoglobin: 15 g/dL (ref 13.0–17.0)
Lymphs Abs: 2.2 10*3/uL (ref 0.7–4.0)
MCHC: 33 g/dL (ref 30.0–36.0)
Monocytes Absolute: 0.6 10*3/uL (ref 0.1–1.0)
Neutro Abs: 8.1 10*3/uL — ABNORMAL HIGH (ref 1.4–7.7)
Platelets: 267 10*3/uL (ref 150.0–400.0)
RDW: 13.1 % (ref 11.5–14.6)

## 2012-01-15 LAB — LDL CHOLESTEROL, DIRECT: Direct LDL: 153.1 mg/dL

## 2012-01-15 LAB — BASIC METABOLIC PANEL
BUN: 13 mg/dL (ref 6–23)
CO2: 27 mEq/L (ref 19–32)
Glucose, Bld: 90 mg/dL (ref 70–99)
Potassium: 4.3 mEq/L (ref 3.5–5.1)
Sodium: 138 mEq/L (ref 135–145)

## 2012-01-15 LAB — HEPATIC FUNCTION PANEL
Albumin: 4.1 g/dL (ref 3.5–5.2)
Total Protein: 7.7 g/dL (ref 6.0–8.3)

## 2012-01-15 LAB — LIPID PANEL
Cholesterol: 219 mg/dL — ABNORMAL HIGH (ref 0–200)
HDL: 61.5 mg/dL (ref >39.00)
Triglycerides: 106 mg/dL (ref 0.0–149.0)

## 2012-01-15 MED ORDER — FEXOFENADINE HCL 180 MG PO TABS: 180.0000 mg | ORAL_TABLET | Freq: Every day | ORAL | Status: DC

## 2012-01-15 MED ORDER — FLUTICASONE PROPIONATE 50 MCG/ACT NA SUSP: 2.0000 | Freq: Every day | NASAL | Status: DC

## 2012-01-15 NOTE — Assessment & Plan Note (Signed)
Improved with neupro patches.

## 2012-01-15 NOTE — Progress Notes (Signed)
  Subjective:    Patient ID: Todd Weiss, male    DOB: [birth year], 41 y.o.   MRN: [id number]  HPI  41 year old white male with restless leg syndrome for follow up. Patient reports symptoms are significantly improved since starting neupro patches.  Patient complains of sinus/upper respiratory congestion for last 1 week. He reports symptoms slightly improved with use of over-the-counter nasal saline but he is experiencing recurrent symptoms. Also, no relief with use of Tylenol Sinus. Initially he has sneezing and mild sore throat. He denies fever or chills. He denies any purulent nasal discharge.  Review of Systems See HPI  Past Medical History  Diagnosis Date  . Migraine   . Kidney calculi 10/2011    small kidney stone.  Stone analysis not performed  . Tobacco abuse     History   Social History  . Marital Status: Single    Spouse Name: N/A    Number of Children: N/A  . Years of Education: N/A   Occupational History  . Not on file.   Social History Main Topics  . Smoking status: Current Every Day Smoker  . Smokeless tobacco: Not on file  . Alcohol Use: No  . Drug Use: No  . Sexually Active:    Other Topics Concern  . Not on file   Social History Narrative  . No narrative on file    No past surgical history on file.  Family History  Problem Relation Age of Onset  . Diabetes Father   . Lymphoma Father     Non Hodgkins Lymphoma    No Known Allergies  Current Outpatient Prescriptions on File Prior to Visit  Medication Sig Dispense Refill  . rotigotine (NEUPRO) 2 MG/24HR Place 1 patch onto the skin daily.  30 patch  5  . fexofenadine (ALLEGRA) 180 MG tablet Take 1 tablet (180 mg total) by mouth daily.  30 tablet  1  . fluticasone (FLONASE) 50 MCG/ACT nasal spray Place 2 sprays into the nose daily.  16 g  2    BP 114/78  Pulse 72  Temp 98.3 F (36.8 C)  Resp 16  Ht 6\' 1"  (1.854 m)  Wt 248 lb (112.492 kg)  BMI 32.72 kg/m2       Objective:   Physical Exam  Constitutional: He is oriented to person, place, and time. He appears well-developed and well-nourished.  HENT:  Head: Normocephalic and atraumatic.  Right Ear: External ear normal.  Left Ear: External ear normal.  Mouth/Throat: No oropharyngeal exudate.       Mild oropharyngeal erythema  Neck: Neck supple.  Cardiovascular: Normal rate, regular rhythm and normal heart sounds.   Pulmonary/Chest: Breath sounds normal. He has no wheezes.  Lymphadenopathy:    He has no cervical adenopathy.  Neurological: He is alert and oriented to person, place, and time. No cranial nerve deficit.          Assessment & Plan:

## 2012-01-15 NOTE — Assessment & Plan Note (Signed)
41 year old white male complains of sinus congestion x 1 week. He does not have any fever or purulent nasal discharge. I suspect he may have viral URI versus allergic reaction to mold or ragweed.  Trial of fexofenadine 180 mg once daily and Flonase 2 sprays in each nostril once daily.  Patient advised to call office if symptoms persist or worsen.

## 2012-01-15 NOTE — Patient Instructions (Addendum)
Please call our office if your symptoms do not improve or gets worse. Use nicotine lozenges to help you quit smoking. Goal weight loss - at least 10 lbs before your next office visit

## 2012-02-03 ENCOUNTER — Encounter: Payer: Self-pay | Admitting: Internal Medicine

## 2012-02-03 ENCOUNTER — Ambulatory Visit (INDEPENDENT_AMBULATORY_CARE_PROVIDER_SITE_OTHER): Payer: BC Managed Care – PPO | Admitting: Internal Medicine

## 2012-02-03 VITALS — BP 128/94 | HR 80 | Temp 98.2°F | Wt 249.0 lb

## 2012-02-03 DIAGNOSIS — R7303 Prediabetes: Secondary | ICD-10-CM

## 2012-02-03 DIAGNOSIS — E785 Hyperlipidemia, unspecified: Secondary | ICD-10-CM

## 2012-02-03 DIAGNOSIS — R7309 Other abnormal glucose: Secondary | ICD-10-CM

## 2012-02-03 DIAGNOSIS — J45909 Unspecified asthma, uncomplicated: Secondary | ICD-10-CM

## 2012-02-03 MED ORDER — DOXYCYCLINE HYCLATE 100 MG PO TABS: 100.0000 mg | ORAL_TABLET | Freq: Two times a day (BID) | ORAL | Status: DC

## 2012-02-03 MED ORDER — METFORMIN HCL 500 MG PO TABS: 500.0000 mg | ORAL_TABLET | Freq: Two times a day (BID) | ORAL | Status: DC

## 2012-02-03 MED ORDER — PRAVASTATIN SODIUM 40 MG PO TABS: 40.0000 mg | ORAL_TABLET | Freq: Every day | ORAL | Status: DC

## 2012-02-03 NOTE — Assessment & Plan Note (Signed)
Patient still coughing intermittently.  Treat with doxycycline 100 mg bid for 10 days.  He has 5 cats and 1 dog at home.  Check allergy panel.

## 2012-02-03 NOTE — Progress Notes (Signed)
  Subjective:    Patient ID: Todd Weiss, male    DOB: [birth year], 41 y.o.   MRN: [id number]  HPI  41 year old white male for followup regarding abnormal labs. He has family history of type 2 diabetes. His father had diabetes while he was living.   Patient's A1c is 6.4. He has abdominal obesity. We reviewed his current diet in detail.  Patient also has hyperlipidemia. LDL is greater than 150.  He still has intermittent cough. Patient has history of asthma. He has 5 cats and one dog at home. He has not been allergy tested in the past.  Review of Systems Negative for chest pain or shortness of breath  Past Medical History  Diagnosis Date  . Migraine   . Kidney calculi 10/2011    small kidney stone.  Stone analysis not performed  . Tobacco abuse     History   Social History  . Marital Status: Single    Spouse Name: N/A    Number of Children: N/A  . Years of Education: N/A   Occupational History  . Not on file.   Social History Main Topics  . Smoking status: Current Every Day Smoker  . Smokeless tobacco: Not on file  . Alcohol Use: No  . Drug Use: No  . Sexually Active:    Other Topics Concern  . Not on file   Social History Narrative  . No narrative on file    No past surgical history on file.  Family History  Problem Relation Age of Onset  . Diabetes Father   . Lymphoma Father     Non Hodgkins Lymphoma    No Known Allergies  Current Outpatient Prescriptions on File Prior to Visit  Medication Sig Dispense Refill  . fexofenadine (ALLEGRA) 180 MG tablet Take 1 tablet (180 mg total) by mouth daily.  30 tablet  1  . fluticasone (FLONASE) 50 MCG/ACT nasal spray Place 2 sprays into the nose daily.  16 g  2  . rotigotine (NEUPRO) 2 MG/24HR Place 1 patch onto the skin daily.  30 patch  5  . metFORMIN (GLUCOPHAGE) 500 MG tablet Take 1 tablet (500 mg total) by mouth 2 (two) times daily with a meal.  60 tablet  1  . pravastatin (PRAVACHOL) 40 MG tablet Take 1  tablet (40 mg total) by mouth daily.  30 tablet  3    BP 128/94  Pulse 80  Temp 98.2 F (36.8 C) (Oral)  Wt 249 lb (112.946 kg)       Objective:   Physical Exam  Constitutional: He is oriented to person, place, and time. He appears well-developed and well-nourished.  HENT:  Head: Normocephalic and atraumatic.  Cardiovascular: Normal rate, regular rhythm and normal heart sounds.   Pulmonary/Chest: Effort normal.       Faint expiratory wheeze right lung field  Abdominal: Soft. Bowel sounds are normal.       Abdominal obesity  Musculoskeletal: He exhibits no edema.  Neurological: He is alert and oriented to person, place, and time.  Skin: Skin is warm and dry.  Psychiatric: He has a normal mood and affect. His behavior is normal.          Assessment & Plan:

## 2012-02-03 NOTE — Patient Instructions (Addendum)
Please complete the following lab tests before your next follow up appointment: BMET, A1c - 790.29 FLP, LFTs - 272.4 [place] Allergy panel - 493.90

## 2012-02-03 NOTE — Assessment & Plan Note (Signed)
Patient's family history diabetes. His abdominal obesity. His A1c is 6.4. I recommend aggressive treatment. I reviewed carb modified diet in detail. Educational material provided. Patient advised to limit his carbohydrate intake to 75-80 g per day. Start metformin 5 mg twice daily.  Monitor A1c.  Start regular exercise program.

## 2012-02-03 NOTE — Assessment & Plan Note (Signed)
Start pravastatin.  Goal LDL < 100 considering pre diabetes.   Lab Results  Component Value Date   CHOL 219* 01/15/2012   HDL 61.50 01/15/2012   LDLDIRECT 153.1 01/15/2012   TRIG 106.0 01/15/2012   CHOLHDL 4 01/15/2012

## 2012-04-16 ENCOUNTER — Ambulatory Visit: Payer: BC Managed Care – PPO | Admitting: Internal Medicine

## 2012-04-17 ENCOUNTER — Ambulatory Visit (INDEPENDENT_AMBULATORY_CARE_PROVIDER_SITE_OTHER): Payer: BC Managed Care – PPO | Admitting: Internal Medicine

## 2012-04-17 ENCOUNTER — Encounter: Payer: Self-pay | Admitting: Internal Medicine

## 2012-04-17 VITALS — BP 142/88 | HR 92 | Temp 98.2°F | Wt 243.0 lb

## 2012-04-17 DIAGNOSIS — E785 Hyperlipidemia, unspecified: Secondary | ICD-10-CM

## 2012-04-17 DIAGNOSIS — R7309 Other abnormal glucose: Secondary | ICD-10-CM

## 2012-04-17 DIAGNOSIS — I10 Essential (primary) hypertension: Secondary | ICD-10-CM

## 2012-04-17 DIAGNOSIS — M545 Low back pain: Secondary | ICD-10-CM | POA: Insufficient documentation

## 2012-04-17 DIAGNOSIS — R7303 Prediabetes: Secondary | ICD-10-CM

## 2012-04-17 MED ORDER — HYDROCODONE-ACETAMINOPHEN 5-325 MG PO TABS: 1.0000 | ORAL_TABLET | Freq: Three times a day (TID) | ORAL | Status: DC | PRN

## 2012-04-17 MED ORDER — METHOCARBAMOL 500 MG PO TABS: 500.0000 mg | ORAL_TABLET | Freq: Three times a day (TID) | ORAL | Status: DC | PRN

## 2012-04-17 NOTE — Progress Notes (Signed)
  Subjective:    Patient ID: Todd Weiss, male    DOB: May 10, 1970, 42 y.o.   MRN: [id number]  HPI  42 year old white male with prediabetes, obesity and hyperlipidemia for followup. Patient has been following low-carb diet and has lost a moderate amount of weight. He is tolerating metformin without difficulty.  He is also tolerating pravastatin.  Patient complains of low back pain. Symptoms started approximately one month ago. 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 He reports boxes were not particularly heavy. Low back pain localized to the mid lower back.  Low back pain intermittently radiates to left leg but he denies any leg weakness. No report of bladder or bowel incontinence. Back pain worse with movement especially lumbar flexion.  Review of Systems Negative for fever chills, negative for bowel or bladder incontinence  Past Medical History  Diagnosis Date  . Migraine   . Kidney calculi 10/2011    small kidney stone.  Stone analysis not performed  . Tobacco abuse     History   Social History  . Marital Status: Single    Spouse Name: N/A    Number of Children: N/A  . Years of Education: N/A   Occupational History  . Not on file.   Social History Main Topics  . Smoking status: Current Every Day Smoker  . Smokeless tobacco: Not on file  . Alcohol Use: No  . Drug Use: No  . Sexually Active:    Other Topics Concern  . Not on file   Social History Narrative  . No narrative on file    No past surgical history on file.  Family History  Problem Relation Age of Onset  . Diabetes Father   . Lymphoma Father     Non Hodgkins Lymphoma    No Known Allergies  Current Outpatient Prescriptions on File Prior to Visit  Medication Sig Dispense Refill  . fexofenadine (ALLEGRA) 180 MG tablet Take 1 tablet (180 mg total) by mouth daily.  30 tablet  1  . metFORMIN (GLUCOPHAGE) 500 MG tablet Take 1 tablet (500 mg total) by mouth 2 (two) times daily with a  meal.  60 tablet  1  . pravastatin (PRAVACHOL) 40 MG tablet Take 1 tablet (40 mg total) by mouth daily.  30 tablet  3    BP 142/88  Pulse 92  Temp 98.2 F (36.8 C) (Oral)  Wt 243 lb (110.224 kg)       Objective:   Physical Exam  Constitutional: He is oriented to person, place, and time. He appears well-developed and well-nourished.  HENT:  Head: Normocephalic and atraumatic.  Cardiovascular: Normal rate, regular rhythm and normal heart sounds.   Pulmonary/Chest: Effort normal and breath sounds normal. He has no wheezes.  Musculoskeletal:       Tenderness of paraspinal muscles lumbar area L2-4 Pain with lumbar flexion and extension. Patient able to toe and heel walk without difficulty. Negative straight leg raise test  Neurological: He is oriented to person, place, and time.  Skin: Skin is warm and dry.  Psychiatric: He has a normal mood and affect. His behavior is normal.          Assessment & Plan:

## 2012-04-17 NOTE — Assessment & Plan Note (Signed)
Patient has made dietary adjustments resulting in moderate weight loss. Good response to metformin. Monitor A1c.

## 2012-04-17 NOTE — Assessment & Plan Note (Signed)
42 year old white male with signs and symptoms of lumbar strain. Treat with Robaxin and hydrocodone. Patient advised to rest and avoid any strenuous activity. Reassess in 2 weeks.

## 2012-04-17 NOTE — Assessment & Plan Note (Signed)
Patient tolerating pravastatin. Monitor fasting lipid panel and LFTs.

## 2012-04-18 LAB — HEMOGLOBIN A1C: Mean Plasma Glucose: 128 mg/dL — ABNORMAL HIGH (ref <117)

## 2012-04-18 LAB — HEPATIC FUNCTION PANEL
ALT: 36 U/L (ref 0–53)
Albumin: 4.8 g/dL (ref 3.5–5.2)
Indirect Bilirubin: 0.4 mg/dL (ref 0.0–0.9)
Total Protein: 7.4 g/dL (ref 6.0–8.3)

## 2012-04-18 LAB — LIPID PANEL
Cholesterol: 214 mg/dL — ABNORMAL HIGH (ref 0–200)
LDL Cholesterol: 141 mg/dL — ABNORMAL HIGH (ref 0–99)
Total CHOL/HDL Ratio: 3.6 Ratio
VLDL: 14 mg/dL (ref 0–40)

## 2012-04-18 LAB — BASIC METABOLIC PANEL
BUN: 13 mg/dL (ref 6–23)
CO2: 23 mEq/L (ref 19–32)
Glucose, Bld: 73 mg/dL (ref 70–99)
Potassium: 4.6 mEq/L (ref 3.5–5.3)

## 2012-05-01 ENCOUNTER — Ambulatory Visit (INDEPENDENT_AMBULATORY_CARE_PROVIDER_SITE_OTHER): Payer: BC Managed Care – PPO | Admitting: Internal Medicine

## 2012-05-01 ENCOUNTER — Encounter: Payer: Self-pay | Admitting: Internal Medicine

## 2012-05-01 VITALS — BP 114/96 | Temp 98.3°F | Wt 243.0 lb

## 2012-05-01 DIAGNOSIS — R7303 Prediabetes: Secondary | ICD-10-CM

## 2012-05-01 DIAGNOSIS — R7309 Other abnormal glucose: Secondary | ICD-10-CM

## 2012-05-01 DIAGNOSIS — M545 Low back pain: Secondary | ICD-10-CM

## 2012-05-01 MED ORDER — METHOCARBAMOL 500 MG PO TABS: 500.0000 mg | ORAL_TABLET | Freq: Three times a day (TID) | ORAL | Status: DC | PRN

## 2012-05-01 NOTE — Assessment & Plan Note (Signed)
No change in treatment plan.  Monitor A1c before next OV. A1c improved to 6.1 from 6.4.

## 2012-05-01 NOTE — Assessment & Plan Note (Signed)
Mild improvement of low back pain/lumbar strain with use of muscle relaxers and rest. I suggest trial of physical therapy.  Continue robaxin 500 mg tid prn.

## 2012-05-01 NOTE — Patient Instructions (Addendum)
Please complete the following lab tests before your next follow up appointment: BMET, A1c - 790.29 

## 2012-05-01 NOTE — Progress Notes (Signed)
  Subjective:    Patient ID: Todd Weiss, male    DOB: 1970/04/18, 42 y.o.   MRN: [id number]  HPI  42 year old white male with prediabetes hyperlipidemia, hypertension for followup regarding low back pain.  Patient was seen on 04/17/2012 for back pain that started after lifting boxes.  Patient was presumed to have lumbar strain. He has been using muscle relaxers and hydrocodone as needed. Patient reports low back pain is improved but not completely resolved.  Prediabetes-patient has made dietary changes. He is tolerating metformin without difficulty. His A1c has improved to 6.1.  Review of Systems Negative for chest pain    Past Medical History  Diagnosis Date  . Migraine   . Kidney calculi 10/2011    small kidney stone.  Stone analysis not performed  . Tobacco abuse     History   Social History  . Marital Status: Single    Spouse Name: N/A    Number of Children: N/A  . Years of Education: N/A   Occupational History  . Not on file.   Social History Main Topics  . Smoking status: Current Every Day Smoker  . Smokeless tobacco: Not on file  . Alcohol Use: No  . Drug Use: No  . Sexually Active:    Other Topics Concern  . Not on file   Social History Narrative  . No narrative on file    No past surgical history on file.  Family History  Problem Relation Age of Onset  . Diabetes Father   . Lymphoma Father     Non Hodgkins Lymphoma    No Known Allergies  Current Outpatient Prescriptions on File Prior to Visit  Medication Sig Dispense Refill  . fexofenadine (ALLEGRA) 180 MG tablet Take 1 tablet (180 mg total) by mouth daily.  30 tablet  1  . HYDROcodone-acetaminophen (NORCO/VICODIN) 5-325 MG per tablet Take 1 tablet by mouth every 8 (eight) hours as needed for pain.  30 tablet  0  . metFORMIN (GLUCOPHAGE) 500 MG tablet Take 1 tablet (500 mg total) by mouth 2 (two) times daily with a meal.  60 tablet  1  . methocarbamol (ROBAXIN) 500 MG tablet Take 1 tablet  (500 mg total) by mouth 3 (three) times daily as needed.  30 tablet  0  . pravastatin (PRAVACHOL) 40 MG tablet Take 1 tablet (40 mg total) by mouth daily.  30 tablet  3    BP 114/96  Temp 98.3 F (36.8 C) (Oral)  Wt 243 lb (110.224 kg)    Objective:   Physical Exam  Constitutional: He appears well-developed and well-nourished.  Cardiovascular: Normal rate, regular rhythm and normal heart sounds.   Pulmonary/Chest: Effort normal and breath sounds normal. He has no wheezes.  Musculoskeletal:       Mild tenderness to lumbar paraspinal muscles - level L2-L3  Neurological: He displays normal reflexes. He exhibits normal muscle tone. Coordination normal.  Psychiatric: He has a normal mood and affect. His behavior is normal.          Assessment & Plan:

## 2012-05-05 ENCOUNTER — Ambulatory Visit: Payer: BC Managed Care – PPO | Attending: Internal Medicine

## 2012-05-05 DIAGNOSIS — M545 Low back pain, unspecified: Secondary | ICD-10-CM | POA: Insufficient documentation

## 2012-05-05 DIAGNOSIS — IMO0001 Reserved for inherently not codable concepts without codable children: Secondary | ICD-10-CM | POA: Insufficient documentation

## 2012-05-05 DIAGNOSIS — R5381 Other malaise: Secondary | ICD-10-CM | POA: Insufficient documentation

## 2012-05-07 ENCOUNTER — Ambulatory Visit: Payer: BC Managed Care – PPO | Admitting: Physical Therapy

## 2012-05-11 ENCOUNTER — Ambulatory Visit: Payer: BC Managed Care – PPO | Admitting: Physical Therapy

## 2012-05-13 ENCOUNTER — Encounter: Payer: BC Managed Care – PPO | Admitting: Physical Therapy

## 2012-05-15 ENCOUNTER — Emergency Department (HOSPITAL_COMMUNITY)
Admission: EM | Admit: 2012-05-15 | Discharge: 2012-05-15 | Disposition: A | Discharge status: Home / Self Care | Payer: BC Managed Care – PPO | Source: Home / Self Care | Attending: Family Medicine | Admitting: Family Medicine

## 2012-05-15 ENCOUNTER — Encounter (HOSPITAL_COMMUNITY): Payer: Self-pay | Admitting: Emergency Medicine

## 2012-05-15 DIAGNOSIS — R109 Unspecified abdominal pain: Secondary | ICD-10-CM

## 2012-05-15 LAB — POCT URINALYSIS DIP (DEVICE)
Nitrite: NEGATIVE
Protein, ur: NEGATIVE mg/dL
Specific Gravity, Urine: 1.02 (ref 1.005–1.030)
Urobilinogen, UA: 0.2 mg/dL (ref 0.0–1.0)

## 2012-05-15 MED ORDER — ONDANSETRON 4 MG PO TBDP: 4.0000 mg | ORAL_TABLET | Freq: Three times a day (TID) | ORAL | Status: DC | PRN

## 2012-05-15 MED ORDER — HYDROCODONE-ACETAMINOPHEN 5-325 MG PO TABS: 2.0000 | ORAL_TABLET | Freq: Three times a day (TID) | ORAL | Status: DC | PRN

## 2012-05-15 MED ORDER — HYDROCODONE-ACETAMINOPHEN 5-325 MG PO TABS
1.0000 | ORAL_TABLET | Freq: Once | ORAL | Status: AC
  Administered 2012-05-15: 1 via ORAL

## 2012-05-15 MED ORDER — HYDROCODONE-ACETAMINOPHEN 5-325 MG PO TABS
ORAL_TABLET | ORAL | Status: AC
  Filled 2012-05-15: qty 1

## 2012-05-15 NOTE — ED Notes (Addendum)
Pt is here for lower back pain since 1030 today Believes it was a kidney stone; was not able to urinate until he got here at the [facility] and passing a out what seemed like a stone Sx include: sweating, intermittent sharppain, dizziness Denies: f/v/n/d, hematuria, dysuria Hx of kidney stones and chronic back pain Has not had any meds today  He is alert w/no signs of acute distress.

## 2012-05-18 ENCOUNTER — Ambulatory Visit: Payer: BC Managed Care – PPO

## 2012-05-20 ENCOUNTER — Ambulatory Visit: Payer: BC Managed Care – PPO | Admitting: Physical Therapy

## 2012-05-20 NOTE — ED Provider Notes (Signed)
History     CSN: [id number]  Arrival date & time 05/15/12  1308   First MD Initiated Contact with Patient 05/15/12 1334      Chief Complaint  Patient presents with  . Back Pain    (Consider location/radiation/quality/duration/timing/severity/associated sxs/prior treatment) HPI Comments: 42 y/o smoker male with h/o recurrent Urolithiasis.Here c/o sudden onset of right flank pain at 10:30am (4 hours ago).symptoms associated with sweating and nausea, denies vomiting. Had "bladder colics" as well and was not able to produce urine at home. Patient states he was able to void while in the waiting area and passed a stone about the size of small nail head. No obvious hematuria. Has not taken any medications. No fever or chills.    Past Medical History  Diagnosis Date  . Migraine   . Kidney calculi 10/2011    small kidney stone.  Stone analysis not performed  . Tobacco abuse     History reviewed. No pertinent past surgical history.  Family History  Problem Relation Age of Onset  . Diabetes Father   . Lymphoma Father     Non Hodgkins Lymphoma    History  Substance Use Topics  . Smoking status: Current Every Day Smoker  . Smokeless tobacco: Not on file  . Alcohol Use: No      Review of Systems  Constitutional: Negative for fever and chills.  Gastrointestinal: Positive for nausea. Negative for vomiting and diarrhea.  Genitourinary: Positive for dysuria and flank pain. Negative for discharge, scrotal swelling and testicular pain.  Musculoskeletal: Positive for back pain.  Skin: Negative for rash.  Neurological: Negative for headaches.    Allergies  Review of patient's allergies indicates no known allergies.  Home Medications   Current Outpatient Rx  Name  Route  Sig  Dispense  Refill  . fexofenadine (ALLEGRA) 180 MG tablet   Oral   Take 1 tablet (180 mg total) by mouth daily.   30 tablet   1   . HYDROcodone-acetaminophen (NORCO/VICODIN) 5-325 MG per tablet    Oral   Take 2 tablets by mouth every 8 (eight) hours as needed for pain.   10 tablet   0   . metFORMIN (GLUCOPHAGE) 500 MG tablet   Oral   Take 1 tablet (500 mg total) by mouth 2 (two) times daily with a meal.   60 tablet   1   . methocarbamol (ROBAXIN) 500 MG tablet   Oral   Take 1 tablet (500 mg total) by mouth 3 (three) times daily as needed.   30 tablet   0   . ondansetron (ZOFRAN-ODT) 4 MG disintegrating tablet   Oral   Take 1 tablet (4 mg total) by mouth every 8 (eight) hours as needed for nausea.   10 tablet   0   . pravastatin (PRAVACHOL) 40 MG tablet   Oral   Take 1 tablet (40 mg total) by mouth daily.   30 tablet   3     BP 127/87  Pulse 89  Temp(Src) 97.8 F (36.6 C) (Oral)  Resp 18  SpO2 93%  Physical Exam  Nursing note and vitals reviewed. Constitutional: He is oriented to person, place, and time. He appears well-developed and well-nourished. No distress.  Appears comfortable.  HENT:  Head: Normocephalic and atraumatic.  Eyes: No scleral icterus.  Cardiovascular: Normal heart sounds.   Pulmonary/Chest: Breath sounds normal.  Abdominal: Soft. Bowel sounds are normal. He exhibits no distension and no mass. There is no tenderness. There  is no rebound and no guarding.  No CVT bilaterally.  Neurological: He is alert and oriented to person, place, and time.  Skin: No rash noted. He is not diaphoretic.    ED Course  Procedures (including critical care time)  Labs Reviewed  POCT URINALYSIS DIP (DEVICE) - Abnormal; Notable for the following:    Hgb urine dipstick MODERATE (*)    All other components within normal limits   No results found.   1. Right flank pain       MDM  Declined xrays. Treated with ondansetron and hydrocodone. Straining cup provided. Asked to follow up with urology or go to the ED if worsening symptoms.         Sharin Grave, MD 05/20/12 [phone]

## 2012-05-25 ENCOUNTER — Ambulatory Visit: Payer: BC Managed Care – PPO

## 2012-05-27 ENCOUNTER — Ambulatory Visit: Payer: BC Managed Care – PPO | Admitting: Physical Therapy

## 2012-06-09 ENCOUNTER — Ambulatory Visit: Payer: BC Managed Care – PPO | Attending: Internal Medicine

## 2012-06-09 DIAGNOSIS — M545 Low back pain, unspecified: Secondary | ICD-10-CM | POA: Insufficient documentation

## 2012-06-09 DIAGNOSIS — IMO0001 Reserved for inherently not codable concepts without codable children: Secondary | ICD-10-CM | POA: Insufficient documentation

## 2012-06-09 DIAGNOSIS — R5381 Other malaise: Secondary | ICD-10-CM | POA: Insufficient documentation

## 2012-06-10 ENCOUNTER — Ambulatory Visit: Payer: BC Managed Care – PPO

## 2012-07-29 ENCOUNTER — Encounter: Payer: Self-pay | Admitting: Internal Medicine

## 2012-07-29 ENCOUNTER — Ambulatory Visit (INDEPENDENT_AMBULATORY_CARE_PROVIDER_SITE_OTHER): Payer: BC Managed Care – PPO | Admitting: Internal Medicine

## 2012-07-29 ENCOUNTER — Ambulatory Visit (INDEPENDENT_AMBULATORY_CARE_PROVIDER_SITE_OTHER)
Admission: RE | Admit: 2012-07-29 | Discharge: 2012-07-29 | Disposition: A | Discharge status: Home / Self Care | Payer: BC Managed Care – PPO | Source: Ambulatory Visit | Attending: Internal Medicine | Admitting: Internal Medicine

## 2012-07-29 VITALS — BP 122/92 | Temp 97.9°F | Wt 243.0 lb

## 2012-07-29 DIAGNOSIS — M79671 Pain in right foot: Secondary | ICD-10-CM | POA: Insufficient documentation

## 2012-07-29 DIAGNOSIS — M79609 Pain in unspecified limb: Secondary | ICD-10-CM

## 2012-07-29 NOTE — Patient Instructions (Addendum)
Take ibuprofen 400-600 mg twice daily with food as needed Please contact our office if your symptoms do not improve or gets worse.

## 2012-07-29 NOTE — Progress Notes (Signed)
  Subjective:    Patient ID: Todd Weiss, male    DOB: [birth year], 42 y.o.   MRN: [id number]  HPI  42 year old white male with history of prediabetes and restless leg syndrome complains of right foot pain for 2 weeks. She denies any precipitating injury or trauma. His symptoms are localized to the lateral aspect of right foot. He denies any redness or swelling.  Review of Systems Negative for fever or chills, he denies any recent procedures  Past Medical History  Diagnosis Date  . Migraine   . Kidney calculi 10/2011    small kidney stone.  Stone analysis not performed  . Tobacco abuse     History   Social History  . Marital Status: Single    Spouse Name: N/A    Number of Children: N/A  . Years of Education: N/A   Occupational History  . Not on file.   Social History Main Topics  . Smoking status: Current Every Day Smoker  . Smokeless tobacco: Not on file  . Alcohol Use: No  . Drug Use: No  . Sexually Active:    Other Topics Concern  . Not on file   Social History Narrative  . No narrative on file    No past surgical history on file.  Family History  Problem Relation Age of Onset  . Diabetes Father   . Lymphoma Father     Non Hodgkins Lymphoma    No Known Allergies  Current Outpatient Prescriptions on File Prior to Visit  Medication Sig Dispense Refill  . fexofenadine (ALLEGRA) 180 MG tablet Take 1 tablet (180 mg total) by mouth daily.  30 tablet  1  . metFORMIN (GLUCOPHAGE) 500 MG tablet Take 1 tablet (500 mg total) by mouth 2 (two) times daily with a meal.  60 tablet  1  . methocarbamol (ROBAXIN) 500 MG tablet Take 1 tablet (500 mg total) by mouth 3 (three) times daily as needed.  30 tablet  0  . pravastatin (PRAVACHOL) 40 MG tablet Take 1 tablet (40 mg total) by mouth daily.  30 tablet  3   No current facility-administered medications on file prior to visit.    BP 122/92  Temp(Src) 97.9 F (36.6 C) (Oral)  Wt 243 lb (110.224 kg)  BMI 32.07  kg/m2         Objective:   Physical Exam  Constitutional: He appears well-developed and well-nourished.  HENT:  Head: Normocephalic and atraumatic.  Mouth/Throat: Oropharynx is clear and moist.  Cardiovascular: Normal rate and normal heart sounds.   No murmur heard. Pulmonary/Chest: Effort normal and breath sounds normal. He has no wheezes.  Musculoskeletal:  Right lateral foot tenderness near 4 th and 5 th digit.  Normal sensation to temperature and vibration  Skin: No rash noted. No erythema.          Assessment & Plan:

## 2012-07-29 NOTE — Assessment & Plan Note (Signed)
42 year old white male with right foot pain of unclear etiology. No precipitating trauma or injury. On exam he has tenderness near base of fourth and fifth digit of right foot. Question gout versus stress fracture. Obtain CBC, sedimentation rate, uric acid. Obtain x-ray of right foot. Use ibuprofen 400- 600 mg twice a day as needed.

## 2012-08-07 ENCOUNTER — Ambulatory Visit: Payer: BC Managed Care – PPO | Admitting: Internal Medicine

## 2012-08-19 ENCOUNTER — Other Ambulatory Visit (INDEPENDENT_AMBULATORY_CARE_PROVIDER_SITE_OTHER): Payer: BC Managed Care – PPO

## 2012-08-19 DIAGNOSIS — I10 Essential (primary) hypertension: Secondary | ICD-10-CM

## 2012-08-19 DIAGNOSIS — G2581 Restless legs syndrome: Secondary | ICD-10-CM

## 2012-08-19 LAB — HEPATIC FUNCTION PANEL
Albumin: 3.9 g/dL (ref 3.5–5.2)
Total Protein: 7.3 g/dL (ref 6.0–8.3)

## 2012-08-19 LAB — IBC PANEL
Iron: 106 ug/dL (ref 42–165)
Saturation Ratios: 26 % (ref 20.0–50.0)
Transferrin: 290.7 mg/dL (ref 212.0–360.0)

## 2012-08-19 LAB — CBC WITH DIFFERENTIAL/PLATELET
Basophils Absolute: 0 10*3/uL (ref 0.0–0.1)
Basophils Relative: 0.4 % (ref 0.0–3.0)
Eosinophils Absolute: 0.4 10*3/uL (ref 0.0–0.7)
HCT: 45.5 % (ref 39.0–52.0)
Hemoglobin: 15 g/dL (ref 13.0–17.0)
Lymphocytes Relative: 21.8 % (ref 12.0–46.0)
Lymphs Abs: 2 10*3/uL (ref 0.7–4.0)
MCHC: 33 g/dL (ref 30.0–36.0)
MCV: 83.8 fl (ref 78.0–100.0)
Monocytes Absolute: 0.7 10*3/uL (ref 0.1–1.0)
Neutro Abs: 6.2 10*3/uL (ref 1.4–7.7)
RDW: 14 % (ref 11.5–14.6)

## 2012-08-19 LAB — BASIC METABOLIC PANEL
CO2: 29 mEq/L (ref 19–32)
Calcium: 9.3 mg/dL (ref 8.4–10.5)
Chloride: 106 mEq/L (ref 96–112)
Glucose, Bld: 93 mg/dL (ref 70–99)
Sodium: 142 mEq/L (ref 135–145)

## 2012-08-19 LAB — LIPID PANEL: Total CHOL/HDL Ratio: 3

## 2012-08-19 LAB — TSH: TSH: 2.23 u[IU]/mL (ref 0.35–5.50)

## 2012-08-19 LAB — HEMOGLOBIN A1C: Hgb A1c MFr Bld: 6.5 % (ref 4.6–6.5)

## 2012-08-26 ENCOUNTER — Ambulatory Visit: Payer: BC Managed Care – PPO | Admitting: Internal Medicine

## 2012-11-12 ENCOUNTER — Emergency Department (HOSPITAL_COMMUNITY)
Admission: EM | Admit: 2012-11-12 | Discharge: 2012-11-12 | Disposition: A | Discharge status: Home / Self Care | Payer: BC Managed Care – PPO | Source: Home / Self Care | Attending: Emergency Medicine | Admitting: Emergency Medicine

## 2012-11-12 ENCOUNTER — Encounter (HOSPITAL_COMMUNITY): Payer: Self-pay | Admitting: Emergency Medicine

## 2012-11-12 ENCOUNTER — Emergency Department (INDEPENDENT_AMBULATORY_CARE_PROVIDER_SITE_OTHER): Payer: BC Managed Care – PPO

## 2012-11-12 DIAGNOSIS — S93409A Sprain of unspecified ligament of unspecified ankle, initial encounter: Secondary | ICD-10-CM

## 2012-11-12 DIAGNOSIS — S93401A Sprain of unspecified ligament of right ankle, initial encounter: Secondary | ICD-10-CM

## 2012-11-12 MED ORDER — OXYCODONE-ACETAMINOPHEN 5-325 MG PO TABS: ORAL_TABLET | ORAL | Status: DC

## 2012-11-12 MED ORDER — IBUPROFEN 800 MG PO TABS
ORAL_TABLET | ORAL | Status: AC
  Filled 2012-11-12: qty 1

## 2012-11-12 MED ORDER — IBUPROFEN 800 MG PO TABS
800.0000 mg | ORAL_TABLET | Freq: Once | ORAL | Status: AC
  Administered 2012-11-12: 800 mg via ORAL

## 2012-11-12 NOTE — ED Notes (Signed)
Patient c/o axillary pain, requests staff readjust crutches.  Readjusted crutches to add comfort to patient's use of crutches.

## 2012-11-12 NOTE — ED Provider Notes (Signed)
Chief Complaint:   Chief Complaint  Patient presents with  . Ankle Pain    History of Present Illness:   Todd Weiss is a 42 year old male who injured his right ankle yesterday. He slipped going down a walkway and twisted his ankle. He felt something pop and the lateral aspect of the ankle swelled up. His toes felt numb and today is unable to bear any weight on the foot. It hurts to move the foot and the ankle.  Review of Systems:  Other than noted above, the patient denies any of the following symptoms: Systemic:  No fevers, chills, sweats, or aches.   Musculoskeletal:  No joint pain, arthritis, bursitis, swelling, back pain, or neck pain. Neurological:  No muscular weakness or paresthesias.  PMFSH:  Past medical history, family history, social history, meds, and allergies were reviewed.   Physical Exam:   Vital signs:  BP 120/73  Pulse 95  Temp(Src) 98 F (36.7 C) (Oral)  Resp 16  SpO2 97% Gen:  Alert and oriented times 3.  In no distress. Musculoskeletal: Exam of the ankle reveals there is swelling and severe pain to palpation over the lateral malleolus. The ankle has very little range of motion with severe pain. There's also pain to palpation of the dorsum of the foot as well.Marland Kitchen Anterior drawer sign , severe pain and could not be performed.  Talar tilt causes severe pain and could not be performed. Squeeze test is positive. Achilles tendon, peroneal tendon, and tibialis posterior were intact. Otherwise, all joints had a full a ROM with no swelling, bruising or deformity.  No edema, pulses full. Extremities were warm and pink.  Capillary refill was brisk.  Skin:  Clear, warm and dry.  No rash. Neuro:  Alert and oriented times 3.  Muscle strength was normal.  Sensation was intact to light touch.   Radiology:  Dg Ankle Complete Right  11/12/2012   *RADIOLOGY REPORT*  Clinical Data: Right ankle pain after injury.  RIGHT ANKLE - COMPLETE 3+ VIEW  Comparison: None.  Findings: No  fracture or dislocation is noted.  Soft tissue swelling is noted over lateral malleolus consistent with ligamentous injury.  Joint spaces are intact.  IMPRESSION: No fracture or dislocation is noted.  Soft tissue swelling is noted over lateral malleolus.   Original Report Authenticated By: Lupita Raider.,  M.D.   Dg Foot Complete Right  11/12/2012   *RADIOLOGY REPORT*  Clinical Data: Right foot pain after injury  RIGHT FOOT COMPLETE - 3+ VIEW  Comparison: None.  Findings: No fracture or dislocation is noted.  Joint spaces are intact. No soft tissue abnormality is noted.  IMPRESSION: Normal right foot.   Original Report Authenticated By: Lupita Raider.,  M.D.   I reviewed the images independently and personally and concur with the radiologist's findings.  Course in Urgent Care Center:    He was given ibuprofen 800 mg by mouth for the pain. He was put in an ASO brace and given crutches.  Assessment:  The encounter diagnosis was Ankle sprain, right, initial encounter.  Plan:   1.  The following meds were prescribed:   Discharge Medication List as of 11/12/2012  3:22 PM    START taking these medications   Details  oxyCODONE-acetaminophen (PERCOCET) 5-325 MG per tablet 1 to 2 tablets every 6 hours as needed for pain., Print       2.  The patient was instructed in symptomatic care, including rest and activity, elevation, application of  ice and compression.  Appropriate handouts were given. 3.  The patient was told to return if becoming worse in any way, if no better in 3 or 4 days, and given some red flag symptoms such as worsening pain or new neurological symptoms that would indicate earlier return.   4.  The patient was told to follow up with Dr. Jodi Geralds next week.    Reuben Likes, MD 11/12/12 [phone]

## 2012-11-12 NOTE — ED Notes (Signed)
Right ankle pain and swelling.  Patient reports slipping on ramp yesterday , ankle buckled, and patient fell.  Today was walking trash can back to house, felt right ankle pain.  Pain in lateral ankle. Good pulses

## 2013-09-29 ENCOUNTER — Emergency Department (INDEPENDENT_AMBULATORY_CARE_PROVIDER_SITE_OTHER): Payer: BC Managed Care – PPO

## 2013-09-29 ENCOUNTER — Emergency Department (HOSPITAL_COMMUNITY)
Admission: EM | Admit: 2013-09-29 | Discharge: 2013-09-29 | Disposition: A | Discharge status: Home / Self Care | Payer: BC Managed Care – PPO | Source: Home / Self Care | Attending: Family Medicine | Admitting: Family Medicine

## 2013-09-29 ENCOUNTER — Encounter (HOSPITAL_COMMUNITY): Payer: Self-pay | Admitting: Emergency Medicine

## 2013-09-29 DIAGNOSIS — IMO0002 Reserved for concepts with insufficient information to code with codable children: Secondary | ICD-10-CM

## 2013-09-29 DIAGNOSIS — X58XXXA Exposure to other specified factors, initial encounter: Secondary | ICD-10-CM

## 2013-09-29 DIAGNOSIS — S93409A Sprain of unspecified ligament of unspecified ankle, initial encounter: Secondary | ICD-10-CM

## 2013-09-29 DIAGNOSIS — S63501A Unspecified sprain of right wrist, initial encounter: Secondary | ICD-10-CM

## 2013-09-29 DIAGNOSIS — S53401A Unspecified sprain of right elbow, initial encounter: Secondary | ICD-10-CM

## 2013-09-29 DIAGNOSIS — S63509A Unspecified sprain of unspecified wrist, initial encounter: Secondary | ICD-10-CM

## 2013-09-29 MED ORDER — KETOROLAC TROMETHAMINE 30 MG/ML IJ SOLN
30.0000 mg | Freq: Once | INTRAMUSCULAR | Status: AC
  Administered 2013-09-29: 30 mg via INTRAMUSCULAR

## 2013-09-29 MED ORDER — NAPROXEN 500 MG PO TABS: 500.0000 mg | ORAL_TABLET | Freq: Two times a day (BID) | ORAL | Status: DC

## 2013-09-29 MED ORDER — KETOROLAC TROMETHAMINE 30 MG/ML IJ SOLN
INTRAMUSCULAR | Status: AC
  Filled 2013-09-29: qty 1

## 2013-09-29 NOTE — ED Notes (Signed)
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

## 2013-09-29 NOTE — Discharge Instructions (Signed)
Your xrays were normal. You have likely sprained or strained your right wrist and elbow. Wear splint as needed for comfort. Ice and elevation to reduce pain and swelling. Naprosyn as directed for pain. If no improvement over the next 7-10 days, please contact sports medicine physician listed on your discharge paperwork for re-evaluation.   Muscle Strain A muscle strain is an injury that occurs when a muscle is stretched beyond its normal length. Usually a small number of muscle fibers are torn when this happens. Muscle strain is rated in degrees. First-degree strains have the least amount of muscle fiber tearing and pain. Second-degree and third-degree strains have increasingly more tearing and pain.  Usually, recovery from muscle strain takes 1-2 weeks. Complete healing takes 5-6 weeks.  CAUSES  Muscle strain happens when a sudden, violent force placed on a muscle stretches it too far. This may occur with lifting, sports, or a fall.  RISK FACTORS Muscle strain is especially common in athletes.  SIGNS AND SYMPTOMS At the site of the muscle strain, there may be:  Pain.  Bruising.  Swelling.  Difficulty using the muscle due to pain or lack of normal function. DIAGNOSIS  Your health care provider will perform a physical exam and ask about your medical history. TREATMENT  Often, the best treatment for a muscle strain is resting, icing, and applying cold compresses to the injured area.  HOME CARE INSTRUCTIONS   Use the PRICE method of treatment to promote muscle healing during the first 2-3 days after your injury. The PRICE method involves:  Protecting the muscle from being injured again.  Restricting your activity and resting the injured body part.  Icing your injury. To do this, put ice in a plastic bag. Place a towel between your skin and the bag. Then, apply the ice and leave it on from 15-20 minutes each hour. After the third day, switch to moist heat packs.  Apply compression to the  injured area with a splint or elastic bandage. Be careful not to wrap it too tightly. This may interfere with blood circulation or increase swelling.  Elevate the injured body part above the level of your heart as often as you can.  Only take over-the-counter or prescription medicines for pain, discomfort, or fever as directed by your health care provider.  Warming up prior to exercise helps to prevent future muscle strains. SEEK MEDICAL CARE IF:   You have increasing pain or swelling in the injured area.  You have numbness, tingling, or a significant loss of strength in the injured area. MAKE SURE YOU:   Understand these instructions.  Will watch your condition.  Will get help right away if you are not doing well or get worse. Document Released: 03/18/2005 Document Revised: 01/06/2013 Document Reviewed: 10/15/2012 [facility] Patient Information 2015 [place], [place]. This information is not intended to replace advice given to you by your health care provider. Make sure you discuss any questions you have with your health care provider.  Ligament Sprain Ligaments are tough, fibrous tissues that hold bones together at the joints. A sprain can occur when a ligament is stretched. This injury may take several weeks to heal. [place] the injured area for as long as directed by your caregiver. Then slowly start using the joint as directed by your caregiver and as the pain allows.  Keep the affected joint raised if possible to lessen swelling.  Apply ice for 15-20 minutes to the injured area every couple hours for  the first half day, then 03-04 times per day for the first 48 hours. Put the ice in a plastic bag and place a towel between the bag of ice and your skin.  Wear any splinting, casting, or elastic bandage applications as instructed.  Only take over-the-counter or prescription medicines for pain, discomfort, or fever as directed by your caregiver. Do not use aspirin  immediately after the injury unless instructed by your caregiver. Aspirin can cause increased bleeding and bruising of the tissues.  If you were given crutches, continue to use them as instructed and do not resume weight bearing on the affected extremity until instructed. SEEK MEDICAL CARE IF:   Your bruising, swelling, or pain increases.  You have cold and numb fingers or toes if your arm or leg was injured. SEEK IMMEDIATE MEDICAL CARE IF:   Your toes are numb or blue if your leg was injured.  Your fingers are numb or blue if your arm was injured.  Your pain is not responding to medicines and continues to stay the same or gets worse. MAKE SURE YOU:   Understand these instructions.  Will watch your condition.  Will get help right away if you are not doing well or get worse. Document Released: 03/15/2000 Document Revised: 06/10/2011 Document Reviewed: 01/12/2008 [facility] Patient Information 2015 [place], [place]. This information is not intended to replace advice given to you by your health care provider. Make sure you discuss any questions you have with your health care provider.  Sprain A sprain is a tear in one of the strong, fibrous tissues that connect your bones (ligaments). The severity of the sprain depends on how much of the ligament is torn. The tear can be either partial or complete. CAUSES  Often, sprains are a result of a fall or an injury. The force of the impact causes the fibers of your ligament to stretch beyond their normal length. This excess tension causes the fibers of your ligament to tear. SYMPTOMS  You may have some loss of motion or increased pain within your normal range of motion. Other symptoms include:  Bruising.  Tenderness.  Swelling. DIAGNOSIS  In order to diagnose a sprain, your caregiver will physically examine you to determine how torn the ligament is. Your caregiver may also suggest an X-ray exam to make sure no bones are broken. TREATMENT  If  your ligament is only partially torn, treatment usually involves keeping the injured area in a fixed position (immobilization) for a short period. To do this, your caregiver will apply a bandage, cast, or splint to keep the area from moving until it heals. For a partially torn ligament, the healing process usually takes 2 to 3 weeks. If your ligament is completely torn, you may need surgery to reconnect the ligament to the bone or to reconstruct the ligament. After surgery, a cast or splint may be applied and will need to stay on for 4 to 6 weeks while your ligament heals. HOME CARE INSTRUCTIONS  Keep the injured area elevated to decrease swelling.  To ease pain and swelling, apply ice to your joint twice a day, for 2 to 3 days.  Put ice in a plastic bag.  Place a towel between your skin and the bag.  Leave the ice on for 15 minutes.  Only take over-the-counter or prescription medicine for pain as directed by your caregiver.  Do not leave the injured area unprotected until pain and stiffness go away (usually 3 to 4 weeks).  Do not  allow your cast or splint to get wet. Cover your cast or splint with a plastic bag when you shower or bathe. Do not swim.  Your caregiver may suggest exercises for you to do during your recovery to prevent or limit permanent stiffness. SEEK IMMEDIATE MEDICAL CARE IF:  Your cast or splint becomes damaged.  Your pain becomes worse. MAKE SURE YOU:  Understand these instructions.  Will watch your condition.  Will get help right away if you are not doing well or get worse. Document Released: 03/15/2000 Document Revised: 06/10/2011 Document Reviewed: 03/30/2011 [facility] Patient Information 2015 [place], [place]. This information is not intended to replace advice given to you by your health care provider. Make sure you discuss any questions you have with your health care provider.  Wrist Pain Wrist injuries are frequent in adults and children. A sprain is an  injury to the ligaments that hold your bones together. A strain is an injury to muscle or muscle cord-like structures (tendons) from stretching or pulling. Generally, when wrists are moderately tender to touch following a fall or injury, a break in the bone (fracture) may be present. Most wrist sprains or strains are better in 3 to 5 days, but complete healing may take several weeks. HOME CARE INSTRUCTIONS   Put ice on the injured area.  Put ice in a plastic bag.  Place a towel between your skin and the bag.  Leave the ice on for 15-20 minutes, 3-4 times a day, for the first 2 days, or as directed by your health care provider.  Keep your arm raised above the level of your heart whenever possible to reduce swelling and pain.  Rest the injured area for at least 48 hours or as directed by your health care provider.  If a splint or elastic bandage has been applied, use it for as long as directed by your health care provider or until seen by a health care provider for a follow-up exam.  Only take over-the-counter or prescription medicines for pain, discomfort, or fever as directed by your health care provider.  Keep all follow-up appointments. You may need to follow up with a specialist or have follow-up X-rays. Improvement in pain level is not a guarantee that you did not fracture a bone in your wrist. The only way to determine whether or not you have a broken bone is by X-ray. SEEK IMMEDIATE MEDICAL CARE IF:   Your fingers are swollen, very red, white, or cold and blue.  Your fingers are numb or tingling.  You have increasing pain.  You have difficulty moving your fingers. MAKE SURE YOU:   Understand these instructions.  Will watch your condition.  Will get help right away if you are not doing well or get worse. Document Released: 12/26/2004 Document Revised: 03/23/2013 Document Reviewed: 05/09/2010 [facility] Patient Information 2015 [place], [place]. This information is not intended  to replace advice given to you by your health care provider. Make sure you discuss any questions you have with your health care provider.

## 2013-09-29 NOTE — ED Provider Notes (Signed)
CSN: [id number]     Arrival date & time 09/29/13  1801 History   First MD Initiated Contact with Patient 09/29/13 1912     Chief Complaint  Patient presents with  . Elbow Injury   (Consider location/radiation/quality/duration/timing/severity/associated sxs/prior Treatment) HPI Comments: Patient states he was at work this morning and at about 10:00am was pushing a box of paper onto a conveyor belt and noticed it needed to be adjusted and pulled the box back to realign and felt a "pop" at his right elbow. States he did not have pain initially but his right elbow began to develop pain around 12noon. States he took two OTC ibuprofen and had to stay at work until his shift was finished and came to [facility] after work for evaluation.  Denies previous injury or surgery.  PCP: Dr. Shawna Orleans  The history is provided by the patient.    Past Medical History  Diagnosis Date  . Migraine   . Kidney calculi 10/2011    small kidney stone.  Stone analysis not performed  . Tobacco abuse    History reviewed. No pertinent past surgical history. Family History  Problem Relation Age of Onset  . Diabetes Father   . Lymphoma Father     Non Hodgkins Lymphoma   History  Substance Use Topics  . Smoking status: Current Every Day Smoker  . Smokeless tobacco: Not on file  . Alcohol Use: No    Review of Systems  All other systems reviewed and are negative.   Allergies  Review of patient's allergies indicates no known allergies.  Home Medications   Prior to Admission medications   Medication Sig Start Date End Date Taking? Authorizing Provider  fexofenadine (ALLEGRA) 180 MG tablet Take 1 tablet (180 mg total) by mouth daily. 01/15/12   Doe-Hyun R Shawna Orleans, DO  metFORMIN (GLUCOPHAGE) 500 MG tablet Take 1 tablet (500 mg total) by mouth 2 (two) times daily with a meal. 02/03/12   Doe-Hyun R Shawna Orleans, DO  methocarbamol (ROBAXIN) 500 MG tablet Take 1 tablet (500 mg total) by mouth 3 (three) times daily as needed. 05/01/12    Doe-Hyun R Shawna Orleans, DO  naproxen (NAPROSYN) 500 MG tablet Take 1 tablet (500 mg total) by mouth 2 (two) times daily with a meal. As needed for pain 09/29/13   Lahoma Rocker, PA  oxyCODONE-acetaminophen (PERCOCET) 5-325 MG per tablet 1 to 2 tablets every 6 hours as needed for pain. 11/12/12   Harden Mo, MD  pravastatin (PRAVACHOL) 40 MG tablet Take 1 tablet (40 mg total) by mouth daily. 02/03/12   Doe-Hyun R Shawna Orleans, DO   BP 117/74  Pulse 76  Temp(Src) 98.2 F (36.8 C) (Oral)  Resp 18  SpO2 96% Physical Exam  Vitals reviewed. Constitutional: He is oriented to person, place, and time. He appears well-developed and well-nourished. No distress.  HENT:  Head: Normocephalic and atraumatic.  Eyes: Conjunctivae are normal. No scleral icterus.  Cardiovascular: Normal rate.   Pulmonary/Chest: Effort normal.  Musculoskeletal: Normal range of motion.       Right shoulder: Normal.       Right elbow: He exhibits swelling. He exhibits normal range of motion, no effusion, no deformity and no laceration. Tenderness found. Radial head, medial epicondyle, lateral epicondyle and olecranon process tenderness noted.       Right wrist: He exhibits tenderness. He exhibits normal range of motion, no bony tenderness, no swelling, no effusion, no crepitus, no deformity and no laceration.  Right hand: Normal. He exhibits normal range of motion. Normal sensation noted. Normal strength noted.  Neurological: He is alert and oriented to person, place, and time.  Skin: Skin is warm and dry. No rash noted. No erythema.  Psychiatric: He has a normal mood and affect. His behavior is normal.    ED Course  Procedures (including critical care time) Labs Review Labs Reviewed - No data to display  Imaging Review Dg Elbow Complete Right  09/29/2013   CLINICAL DATA:  Elbow pain  EXAM: RIGHT ELBOW - COMPLETE 3+ VIEW  COMPARISON:  None  FINDINGS: There is no evidence of fracture, dislocation, or joint effusion. There is  no evidence of arthropathy or other focal bone abnormality. Soft tissues are unremarkable.  IMPRESSION: Negative.   Electronically Signed   By: Kerby Moors M.D.   On: 09/29/2013 20:04     MDM   1. Sprain of right wrist, initial encounter   2. Sprain of right elbow, initial encounter    Films negative for acute injury. Exam without focal deficits other than discomfort at right elbow and right wrist with ROM and palpation. Will treat with velcro wrist splint, ice, elevation, NSAIDs and referral to [facility] for follow up.     [place], [place] 09/29/13 2029

## 2013-09-30 NOTE — ED Provider Notes (Signed)
Medical screening examination/treatment/procedure(s) were performed by a resident physician or non-physician practitioner and as the supervising physician I was immediately available for consultation/collaboration.  Lynne Leader, MD    Gregor Hams, MD 09/30/13 [phone]

## 2014-02-09 ENCOUNTER — Encounter (HOSPITAL_COMMUNITY): Payer: Self-pay

## 2014-02-09 ENCOUNTER — Emergency Department (HOSPITAL_COMMUNITY)
Admission: EM | Admit: 2014-02-09 | Discharge: 2014-02-09 | Disposition: A | Discharge status: Home / Self Care | Payer: BC Managed Care – PPO | Source: Home / Self Care | Attending: Emergency Medicine | Admitting: Emergency Medicine

## 2014-02-09 DIAGNOSIS — M5431 Sciatica, right side: Secondary | ICD-10-CM

## 2014-02-09 MED ORDER — METHYLPREDNISOLONE (PAK) 4 MG PO TABS: ORAL_TABLET | ORAL | Status: DC

## 2014-02-09 MED ORDER — KETOROLAC TROMETHAMINE 60 MG/2ML IM SOLN
INTRAMUSCULAR | Status: AC
  Filled 2014-02-09: qty 2

## 2014-02-09 MED ORDER — TRAMADOL HCL 50 MG PO TABS: 50.0000 mg | ORAL_TABLET | Freq: Four times a day (QID) | ORAL | Status: DC | PRN

## 2014-02-09 MED ORDER — KETOROLAC TROMETHAMINE 60 MG/2ML IM SOLN
60.0000 mg | Freq: Once | INTRAMUSCULAR | Status: AC
  Administered 2014-02-09: 60 mg via INTRAMUSCULAR

## 2014-02-09 NOTE — ED Notes (Signed)
C/o of pain n low back w radiation of pain into right leg . Onset of pain today when at work. No known injury. Denies saddle anesthesia or loss bowel/bladder. Has not taken any medication , "just came on over"

## 2014-02-09 NOTE — ED Provider Notes (Addendum)
CSN: [id number]     Arrival date & time 02/09/14  0902 History   First MD Initiated Contact with Patient 02/09/14 [phone]     Chief Complaint  Patient presents with  . Back Pain   (Consider location/radiation/quality/duration/timing/severity/associated sxs/prior Treatment) HPI Comments: No bowel/ bladder incontinence No saddle anesthesia No rash No fever No recent illness or injury No previous surgery Reports occasions pins and needles sensation at right lateral thigh.   Patient is a 43 y.o. male presenting with back pain. The history is provided by the patient.  Back Pain Location:  Lumbar spine ((R>L)) Quality:  Shooting Radiates to:  R thigh Pain severity:  Moderate Onset quality:  Gradual Duration:  1 day Timing:  Constant Progression:  Unchanged Chronicity:  New Context comment:  Began while loading paper into boxes while at work  this morning   Past Medical History  Diagnosis Date  . Migraine   . Kidney calculi 10/2011    small kidney stone.  Stone analysis not performed  . Tobacco abuse    History reviewed. No pertinent past surgical history. Family History  Problem Relation Age of Onset  . Diabetes Father   . Lymphoma Father     Non Hodgkins Lymphoma   History  Substance Use Topics  . Smoking status: Current Every Day Smoker  . Smokeless tobacco: Not on file  . Alcohol Use: No    Review of Systems  Musculoskeletal: Positive for back pain.  All other systems reviewed and are negative.   Allergies  Review of patient's allergies indicates no known allergies.  Home Medications   Prior to Admission medications   Medication Sig Start Date End Date Taking? Authorizing Provider  fexofenadine (ALLEGRA) 180 MG tablet Take 1 tablet (180 mg total) by mouth daily. 01/15/12   Doe-Hyun R Shawna Orleans, DO  metFORMIN (GLUCOPHAGE) 500 MG tablet Take 1 tablet (500 mg total) by mouth 2 (two) times daily with a meal. 02/03/12   Doe-Hyun R Shawna Orleans, DO  methocarbamol (ROBAXIN) 500  MG tablet Take 1 tablet (500 mg total) by mouth 3 (three) times daily as needed. 05/01/12   Doe-Hyun Kyra Searles, DO  methylPREDNIsolone (MEDROL DOSPACK) 4 MG tablet follow package directions 02/09/14   Lutricia Feil, PA  naproxen (NAPROSYN) 500 MG tablet Take 1 tablet (500 mg total) by mouth 2 (two) times daily with a meal. As needed for pain 09/29/13   Lutricia Feil, PA  oxyCODONE-acetaminophen (PERCOCET) 5-325 MG per tablet 1 to 2 tablets every 6 hours as needed for pain. 11/12/12   Harden Mo, MD  pravastatin (PRAVACHOL) 40 MG tablet Take 1 tablet (40 mg total) by mouth daily. 02/03/12   Doe-Hyun R Shawna Orleans, DO  traMADol (ULTRAM) 50 MG tablet Take 1 tablet (50 mg total) by mouth every 6 (six) hours as needed for moderate pain or severe pain. 02/09/14   Annett Gula H Marlow Berenguer, PA   BP 136/88 mmHg  Pulse 62  Temp(Src) 98.5 F (36.9 C) (Oral)  Resp 20  SpO2 98% Physical Exam  Constitutional: He is oriented to person, place, and time. He appears well-developed and well-nourished. No distress.  HENT:  Head: Normocephalic and atraumatic.  Eyes: Conjunctivae are normal. No scleral icterus.  Cardiovascular: Normal rate, regular rhythm and normal heart sounds.   Pulmonary/Chest: Effort normal and breath sounds normal.  Abdominal: Soft. Bowel sounds are normal.  Musculoskeletal: Normal range of motion.       Lumbar back: He exhibits tenderness. He  exhibits normal range of motion, no swelling, no edema, no deformity and no laceration.       Back:  Neurological: He is alert and oriented to person, place, and time. Coordination and gait normal.  Reflex Scores:      Patellar reflexes are 3+ on the right side and 2+ on the left side. Skin: Skin is warm and dry. No rash noted. No erythema.  Psychiatric: He has a normal mood and affect. His behavior is normal.  Nursing note and vitals reviewed.   ED Course  Procedures (including critical care time) Labs Review Labs Reviewed - No data to  display  Imaging Review No results found.   MDM   1. Sciatica, right    Mild hyperreflexia at right patellar DTR without clonus or additional indications of neuromuscular compromise. No clinical indication of cord compression. Will treat with medrol dose pack and analgesia and advise close follow up with PCP should symptoms persist or worsen    Lutricia Feil, PA 02/09/14 1000  Addendum on 04/26/2014: Patient received Toradol injection on DOS for pain management.   Lutricia Feil, [place] 04/26/14 1018

## 2014-02-09 NOTE — Discharge Instructions (Signed)
Sciatica Sciatica is pain, weakness, numbness, or tingling along the path of the sciatic nerve. The nerve starts in the lower back and runs down the back of each leg. The nerve controls the muscles in the lower leg and in the back of the knee, while also providing sensation to the back of the thigh, lower leg, and the sole of your foot. Sciatica is a symptom of another medical condition. For instance, nerve damage or certain conditions, such as a herniated disk or bone spur on the spine, pinch or put pressure on the sciatic nerve. This causes the pain, weakness, or other sensations normally associated with sciatica. Generally, sciatica only affects one side of the body. CAUSES   Herniated or slipped disc.  Degenerative disk disease.  A pain disorder involving the narrow muscle in the buttocks (piriformis syndrome).  Pelvic injury or fracture.  Pregnancy.  Tumor (rare). SYMPTOMS  Symptoms can vary from mild to very severe. The symptoms usually travel from the low back to the buttocks and down the back of the leg. Symptoms can include:  Mild tingling or dull aches in the lower back, leg, or hip.  Numbness in the back of the calf or sole of the foot.  Burning sensations in the lower back, leg, or hip.  Sharp pains in the lower back, leg, or hip.  Leg weakness.  Severe back pain inhibiting movement. These symptoms may get worse with coughing, sneezing, laughing, or prolonged sitting or standing. Also, being overweight may worsen symptoms. DIAGNOSIS  Your caregiver will perform a physical exam to look for common symptoms of sciatica. He or she may ask you to do certain movements or activities that would trigger sciatic nerve pain. Other tests may be performed to find the cause of the sciatica. These may include:  Blood tests.  X-rays.  Imaging tests, such as an MRI or CT scan. TREATMENT  Treatment is directed at the cause of the sciatic pain. Sometimes, treatment is not necessary  and the pain and discomfort goes away on its own. If treatment is needed, your caregiver may suggest:  Over-the-counter medicines to relieve pain.  Prescription medicines, such as anti-inflammatory medicine, muscle relaxants, or narcotics.  Applying heat or ice to the painful area.  Steroid injections to lessen pain, irritation, and inflammation around the nerve.  Reducing activity during periods of pain.  Exercising and stretching to strengthen your abdomen and improve flexibility of your spine. Your caregiver may suggest losing weight if the extra weight makes the back pain worse.  Physical therapy.  Surgery to eliminate what is pressing or pinching the nerve, such as a bone spur or part of a herniated disk. HOME CARE INSTRUCTIONS   Only take over-the-counter or prescription medicines for pain or discomfort as directed by your caregiver.  Apply ice to the affected area for 20 minutes, 3-4 times a day for the first 48-72 hours. Then try heat in the same way.  Exercise, stretch, or perform your usual activities if these do not aggravate your pain.  Attend physical therapy sessions as directed by your caregiver.  Keep all follow-up appointments as directed by your caregiver.  Do not wear high heels or shoes that do not provide proper support.  Check your mattress to see if it is too soft. A firm mattress may lessen your pain and discomfort. SEEK IMMEDIATE MEDICAL CARE IF:   You lose control of your bowel or bladder (incontinence).  You have increasing weakness in the lower back, pelvis, buttocks,   or legs.  You have redness or swelling of your back.  You have a burning sensation when you urinate.  You have pain that gets worse when you lie down or awakens you at night.  Your pain is worse than you have experienced in the past.  Your pain is lasting longer than 4 weeks.  You are suddenly losing weight without reason. MAKE SURE YOU:  Understand these  instructions.  Will watch your condition.  Will get help right away if you are not doing well or get worse. Document Released: 03/12/2001 Document Revised: 09/17/2011 Document Reviewed: 07/28/2011 [employer] Patient Information 2015 [employer]. This information is not intended to replace advice given to you by your health care provider. Make sure you discuss any questions you have with your health care provider.  

## 2015-07-12 ENCOUNTER — Encounter (HOSPITAL_COMMUNITY): Payer: Self-pay | Admitting: Emergency Medicine

## 2015-07-12 ENCOUNTER — Ambulatory Visit (HOSPITAL_COMMUNITY)
Admission: EM | Admit: 2015-07-12 | Discharge: 2015-07-12 | Disposition: A | Discharge status: Home / Self Care | Payer: BLUE CROSS/BLUE SHIELD | Attending: Family Medicine | Admitting: Family Medicine

## 2015-07-12 DIAGNOSIS — H1011 Acute atopic conjunctivitis, right eye: Secondary | ICD-10-CM | POA: Diagnosis not present

## 2015-07-12 DIAGNOSIS — J309 Allergic rhinitis, unspecified: Principal | ICD-10-CM

## 2015-07-12 MED ORDER — NAPHAZOLINE-PHENIRAMINE 0.025-0.3 % OP SOLN: 1.0000 [drp] | OPHTHALMIC | Status: DC | PRN

## 2015-07-12 NOTE — Discharge Instructions (Signed)
Allergic Conjunctivitis °Allergic conjunctivitis is inflammation of the clear membrane that covers the white part of your eye and the inner surface of your eyelid (conjunctiva), and it is caused by allergies. The blood vessels in the conjunctiva become inflamed, and this causes the eye to become red or pink, and it often causes itchiness in the eye. Allergic conjunctivitis cannot be spread by one person to another person (noncontagious). °CAUSES °This condition is caused by an allergic reaction. Common causes of an allergic reaction (allergens) include: °1. Dust. °2. Pollen. °3. Mold. °4. Animal dander or secretions. °RISK FACTORS °This condition is more likely to develop if you are exposed to high levels of allergens that cause the allergic reaction. This might include being outdoors when air pollen levels are high or being around animals that you are allergic to. °SYMPTOMS °Symptoms of this condition may include: °1. Eye redness. °2. Tearing of the eyes. °3. Watery eyes. °4. Itchy eyes. °5. Burning feeling in the eyes. °6. Clear drainage from the eyes. °7. Swollen eyelids. °DIAGNOSIS °This condition may be diagnosed by medical history and physical exam. If you have drainage from your eyes, it may be tested to rule out other causes of conjunctivitis. °TREATMENT °Treatment for this condition often includes medicines. These may be eye drops, ointments, or oral medicines. They may be prescription medicines or over-the-counter medicines. °HOME CARE INSTRUCTIONS °· Take or apply medicines only as directed by your health care provider. °· Do not touch or rub your eyes. °· Do not wear contact lenses until the inflammation is gone. Wear glasses instead. °· Do not wear eye makeup until the inflammation is gone. °· Apply a cool, clean washcloth to your eye for 10-20 minutes, 3-4 times a day. °· Try to avoid whatever allergen is causing the allergic reaction. °SEEK MEDICAL CARE IF: °· Your symptoms get worse. °· You have pus  draining from your eye. °· You have new symptoms. °· You have a fever. °  °This information is not intended to replace advice given to you by your health care provider. Make sure you discuss any questions you have with your health care provider. °  °Document Released: 06/08/2002 Document Revised: 04/08/2014 Document Reviewed: 12/28/2013 °Elsevier Interactive Patient Education ©2016 [employer] ° °How to Use Eye Drops and Eye Ointments °HOW TO APPLY EYE DROPS °Follow these steps when applying eye drops: °5. Wash your hands. °6. Tilt your head back. °7. Put a finger under your eye and use it to gently pull your lower lid downward. Keep that finger in place. °8. Using your other hand, hold the dropper between your thumb and index finger. °9. Position the dropper just over the edge of the lower lid. Hold it as close to your eye as you can without touching the dropper to your eye. °10. Steady your hand. One way to do this is to lean your index finger against your brow. °11. Look up. °12. Slowly and gently squeeze one drop of medicine into your eye. °13. Close your eye. °14. Place a finger between your lower eyelid and your nose. Press gently for 2 minutes. This increases the amount of time that the medicine is exposed to the eye. It also reduces side effects that can develop if the drop gets into the bloodstream through the nose. °HOW TO APPLY EYE OINTMENTS °Follow these steps when applying eye ointments: °8. Wash your hands. °9. Put a finger under your eye and use it to gently pull your lower lid downward. Keep that   finger in place. °10. Using your other hand, place the tip of the tube between your thumb and index finger with the remaining fingers braced against your cheek or nose. °11. Hold the tube just over the edge of your lower lid without touching the tube to your lid or eyeball. °12. Look up. °13. Line the inner part of your lower lid with ointment. °14. Gently pull up on your upper lid and look down. This will  force the ointment to spread over the surface of the eye. °15. Release the upper lid. °16. If you can, close your eyes for 1-2 minutes. °Do not rub your eyes. If you applied the ointment correctly, your vision will be blurry for a few minutes. This is normal. °ADDITIONAL INFORMATION °· Make sure to use the eye drops or ointment as told by your health care provider. °· If you have been told to use both eye drops and an eye ointment, apply the eye drops first, then wait 3-4 minutes before you apply the ointment. °· Try not to touch the tip of the dropper or tube to your eye. A dropper or tube that has touched the eye can become contaminated. °  °This information is not intended to replace advice given to you by your health care provider. Make sure you discuss any questions you have with your health care provider. °  °Document Released: 06/24/2000 Document Revised: 08/02/2014 Document Reviewed: 03/14/2014 °Elsevier Interactive Patient Education ©2016 [employer] ° °

## 2015-07-12 NOTE — ED Notes (Signed)
The patient presented to the [facility] with a complaint of right eye pain that he describes as itching and burning as well as drainage that started 1 day ago. The patient denied any known injury and stated that he woke up this way.

## 2015-07-13 NOTE — ED Provider Notes (Signed)
CSN: [id number]     Arrival date & time 07/12/15  1918 History   First MD Initiated Contact with Patient 07/12/15 2025     Chief Complaint  Patient presents with  . Eye Pain  . Eye Drainage   (Consider location/radiation/quality/duration/timing/severity/associated sxs/prior Treatment) HPI  2 day history of tearing of right eye. Awoke this morning with eye closed. Washed it and was able to open eye. Was able to work without problem. No vision changes. No pain, there is itching present.  Past Medical History  Diagnosis Date  . Migraine   . Kidney calculi 10/2011    small kidney stone.  Stone analysis not performed  . Tobacco abuse    History reviewed. No pertinent past surgical history. Family History  Problem Relation Age of Onset  . Diabetes Father   . Lymphoma Father     Non Hodgkins Lymphoma   Social History  Substance Use Topics  . Smoking status: Current Every Day Smoker  . Smokeless tobacco: None  . Alcohol Use: No    Review of Systems Eye irritation Allergies  Review of patient's allergies indicates no known allergies.  Home Medications   Prior to Admission medications   Medication Sig Start Date End Date Taking? Authorizing Provider  fexofenadine (ALLEGRA) 180 MG tablet Take 1 tablet (180 mg total) by mouth daily. 01/15/12   Doe-Hyun R Shawna Orleans, DO  metFORMIN (GLUCOPHAGE) 500 MG tablet Take 1 tablet (500 mg total) by mouth 2 (two) times daily with a meal. 02/03/12   Doe-Hyun R Shawna Orleans, DO  methocarbamol (ROBAXIN) 500 MG tablet Take 1 tablet (500 mg total) by mouth 3 (three) times daily as needed. 05/01/12   Doe-Hyun Kyra Searles, DO  methylPREDNIsolone (MEDROL DOSPACK) 4 MG tablet follow package directions 02/09/14   Audelia Hives Presson, PA  naphazoline-pheniramine (NAPHCON-A) 0.025-0.3 % ophthalmic solution Place 1 drop into the right eye every 4 (four) hours as needed for irritation. 07/12/15   Konrad Felix, PA  naproxen (NAPROSYN) 500 MG tablet Take 1 tablet (500 mg total)  by mouth 2 (two) times daily with a meal. As needed for pain 09/29/13   Lutricia Feil, PA  oxyCODONE-acetaminophen (PERCOCET) 5-325 MG per tablet 1 to 2 tablets every 6 hours as needed for pain. 11/12/12   Harden Mo, MD  pravastatin (PRAVACHOL) 40 MG tablet Take 1 tablet (40 mg total) by mouth daily. 02/03/12   Doe-Hyun R Shawna Orleans, DO  traMADol (ULTRAM) 50 MG tablet Take 1 tablet (50 mg total) by mouth every 6 (six) hours as needed for moderate pain or severe pain. 02/09/14   Lutricia Feil, PA   Meds Ordered and Administered this Visit  Medications - No data to display  BP 128/83 mmHg  Pulse 74  Temp(Src) 98.1 F (36.7 C) (Oral)  Resp 18  SpO2 97% No data found.   Physical Exam NURSES NOTES AND VITAL SIGNS REVIEWED. CONSTITUTIONAL: Well developed, well nourished, no acute distress HEENT: normocephalic, atraumatic EYES: Conjunctiva normal on left, right eye mildly injected, conj inflamed. No exudate.no visible corneal abrasion.  NECK:normal ROM, supple, no adenopathy PULMONARY:No respiratory distress, normal effort MUSCULOSKELETAL: Normal ROM of all extremities,  SKIN: warm and dry without rash PSYCHIATRIC: Mood and affect, behavior are normal  ED Course  Procedures (including critical care time)  Labs Review Labs Reviewed - No data to display  Imaging Review No results found.   Visual Acuity Review  Right Eye Distance: 20/30 Left Eye Distance: 20/25  Bilateral Distance: 20/25  Right Eye Near:   Left Eye Near:    Bilateral Near:      RX naphcon   MDM   1. Allergic conjunctivitis and rhinitis, right     Patient is reassured that there are no issues that require transfer to higher level of care at this time or additional tests. Patient is advised to continue home symptomatic treatment. Patient is advised that if there are new or worsening symptoms to attend the emergency department, contact primary care provider, or return to UC. Instructions of  care provided discharged home in stable condition.    THIS NOTE WAS GENERATED USING A VOICE RECOGNITION SOFTWARE PROGRAM. ALL REASONABLE EFFORTS  WERE MADE TO PROOFREAD THIS DOCUMENT FOR ACCURACY.  I have verbally reviewed the discharge instructions with the patient. A printed AVS was given to the patient.  All questions were answered prior to discharge.      Konrad Felix, PA 07/13/15 1024

## 2016-02-20 ENCOUNTER — Emergency Department (HOSPITAL_COMMUNITY)
Admission: EM | Admit: 2016-02-20 | Discharge: 2016-02-20 | Disposition: A | Discharge status: Home / Self Care | Payer: BLUE CROSS/BLUE SHIELD | Attending: Emergency Medicine | Admitting: Emergency Medicine

## 2016-02-20 ENCOUNTER — Ambulatory Visit (INDEPENDENT_AMBULATORY_CARE_PROVIDER_SITE_OTHER): Payer: BLUE CROSS/BLUE SHIELD

## 2016-02-20 ENCOUNTER — Ambulatory Visit (INDEPENDENT_AMBULATORY_CARE_PROVIDER_SITE_OTHER): Payer: BLUE CROSS/BLUE SHIELD | Admitting: Family Medicine

## 2016-02-20 ENCOUNTER — Encounter (HOSPITAL_COMMUNITY): Payer: Self-pay

## 2016-02-20 VITALS — BP 118/80 | HR 90 | Temp 97.6°F | Resp 16 | Ht 71.0 in | Wt 232.0 lb

## 2016-02-20 DIAGNOSIS — J9811 Atelectasis: Secondary | ICD-10-CM | POA: Diagnosis not present

## 2016-02-20 DIAGNOSIS — Z87891 Personal history of nicotine dependence: Secondary | ICD-10-CM | POA: Diagnosis not present

## 2016-02-20 DIAGNOSIS — R0789 Other chest pain: Secondary | ICD-10-CM | POA: Diagnosis not present

## 2016-02-20 DIAGNOSIS — F17200 Nicotine dependence, unspecified, uncomplicated: Secondary | ICD-10-CM | POA: Insufficient documentation

## 2016-02-20 DIAGNOSIS — J181 Lobar pneumonia, unspecified organism: Secondary | ICD-10-CM | POA: Diagnosis not present

## 2016-02-20 DIAGNOSIS — R059 Cough, unspecified: Secondary | ICD-10-CM

## 2016-02-20 DIAGNOSIS — R109 Unspecified abdominal pain: Secondary | ICD-10-CM | POA: Diagnosis not present

## 2016-02-20 DIAGNOSIS — J189 Pneumonia, unspecified organism: Secondary | ICD-10-CM

## 2016-02-20 DIAGNOSIS — I10 Essential (primary) hypertension: Secondary | ICD-10-CM | POA: Diagnosis not present

## 2016-02-20 DIAGNOSIS — J45909 Unspecified asthma, uncomplicated: Secondary | ICD-10-CM | POA: Insufficient documentation

## 2016-02-20 DIAGNOSIS — R05 Cough: Secondary | ICD-10-CM

## 2016-02-20 DIAGNOSIS — R062 Wheezing: Secondary | ICD-10-CM

## 2016-02-20 LAB — POCT URINALYSIS DIP (MANUAL ENTRY)
BILIRUBIN UA: NEGATIVE
GLUCOSE UA: NEGATIVE
Leukocytes, UA: NEGATIVE
Nitrite, UA: NEGATIVE
PH UA: 5.5
RBC UA: NEGATIVE
Spec Grav, UA: 1.025
Urobilinogen, UA: 0.2

## 2016-02-20 LAB — COMPREHENSIVE METABOLIC PANEL
ALBUMIN: 4.1 g/dL (ref 3.5–5.0)
ALT: 36 U/L (ref 17–63)
ANION GAP: 7 (ref 5–15)
AST: 33 U/L (ref 15–41)
Alkaline Phosphatase: 65 U/L (ref 38–126)
BUN: 14 mg/dL (ref 6–20)
CHLORIDE: 108 mmol/L (ref 101–111)
CO2: 25 mmol/L (ref 22–32)
Calcium: 9.3 mg/dL (ref 8.9–10.3)
Creatinine, Ser: 1.14 mg/dL (ref 0.61–1.24)
GFR calc Af Amer: >60 mL/min (ref >60)
GLUCOSE: 106 mg/dL — AB (ref 65–99)
POTASSIUM: 4.6 mmol/L (ref 3.5–5.1)
Sodium: 140 mmol/L (ref 135–145)
TOTAL PROTEIN: 6.8 g/dL (ref 6.5–8.1)
Total Bilirubin: 0.3 mg/dL (ref 0.3–1.2)

## 2016-02-20 LAB — CBC WITH DIFFERENTIAL/PLATELET
BASOS ABS: 0.1 10*3/uL (ref 0.0–0.1)
BASOS PCT: 1 %
EOS PCT: 4 %
Eosinophils Absolute: 0.3 10*3/uL (ref 0.0–0.7)
HCT: 44.7 % (ref 39.0–52.0)
Hemoglobin: 15.3 g/dL (ref 13.0–17.0)
Lymphocytes Relative: 25 %
Lymphs Abs: 2.1 10*3/uL (ref 0.7–4.0)
MCH: 29.3 pg (ref 26.0–34.0)
MCHC: 34.2 g/dL (ref 30.0–36.0)
MCV: 85.6 fL (ref 78.0–100.0)
MONO ABS: 0.5 10*3/uL (ref 0.1–1.0)
MONOS PCT: 6 %
Neutro Abs: 5.3 10*3/uL (ref 1.7–7.7)
Neutrophils Relative %: 64 %
PLATELETS: 215 10*3/uL (ref 150–400)
RBC: 5.22 MIL/uL (ref 4.22–5.81)
RDW: 13.1 % (ref 11.5–15.5)
WBC: 8.2 10*3/uL (ref 4.0–10.5)

## 2016-02-20 LAB — POC MICROSCOPIC URINALYSIS (UMFC): Mucus: ABSENT

## 2016-02-20 MED ORDER — ALBUTEROL SULFATE HFA 108 (90 BASE) MCG/ACT IN AERS
2.0000 | INHALATION_SPRAY | Freq: Once | RESPIRATORY_TRACT | Status: AC
  Administered 2016-02-20: 2 via RESPIRATORY_TRACT
  Filled 2016-02-20: qty 6.7

## 2016-02-20 MED ORDER — IPRATROPIUM BROMIDE 0.02 % IN SOLN
0.5000 mg | Freq: Once | RESPIRATORY_TRACT | Status: AC
Start: 2016-02-20 — End: 2016-02-20
  Administered 2016-02-20: 0.5 mg via RESPIRATORY_TRACT

## 2016-02-20 MED ORDER — METHYLPREDNISOLONE SODIUM SUCC 125 MG IJ SOLR
125.0000 mg | Freq: Once | INTRAMUSCULAR | Status: AC
  Administered 2016-02-20: 125 mg via INTRAVENOUS
  Filled 2016-02-20: qty 2

## 2016-02-20 MED ORDER — IPRATROPIUM BROMIDE 0.02 % IN SOLN
0.5000 mg | Freq: Once | RESPIRATORY_TRACT | Status: AC
  Administered 2016-02-20: 0.5 mg via RESPIRATORY_TRACT
  Filled 2016-02-20: qty 2.5

## 2016-02-20 MED ORDER — AZITHROMYCIN 250 MG PO TABS: 250.0000 mg | ORAL_TABLET | Freq: Every day | ORAL | 0 refills | Status: DC

## 2016-02-20 MED ORDER — BENZONATATE 100 MG PO CAPS: 200.0000 mg | ORAL_CAPSULE | Freq: Two times a day (BID) | ORAL | 0 refills | Status: DC | PRN

## 2016-02-20 MED ORDER — ALBUTEROL SULFATE (2.5 MG/3ML) 0.083% IN NEBU
5.0000 mg | INHALATION_SOLUTION | Freq: Once | RESPIRATORY_TRACT | Status: AC
  Administered 2016-02-20: 5 mg via RESPIRATORY_TRACT
  Filled 2016-02-20: qty 6

## 2016-02-20 MED ORDER — ALBUTEROL SULFATE (2.5 MG/3ML) 0.083% IN NEBU
2.5000 mg | INHALATION_SOLUTION | Freq: Once | RESPIRATORY_TRACT | Status: AC
Start: 2016-02-20 — End: 2016-02-20
  Administered 2016-02-20: 2.5 mg via RESPIRATORY_TRACT

## 2016-02-20 NOTE — ED Provider Notes (Signed)
[facility] DEPT Provider Note   CSN: [id number] Arrival date & time: 02/20/16  1125     History   Chief Complaint Chief Complaint  Patient presents with  . Shortness of Breath    HPI Todd Weiss is a 45 y.o. male.  HPI   Patient is a 45 year old male with no pertinent past medical history presents the ED with complaint of cough. Patient reports having URI symptoms for the past week. He reports initially having nasal congestion and rhinorrhea that have since improved but notes he has continued to have a productive cough with associated wheezing and shortness of breath. Patient reports 3 days ago while he was having a coughing attack he felt a "pop" in his back and states since then he has had worsening coughing, wheezing and shortness of breath. He reports having associated pain to his right lateral chest wall that occurs with coughing or bending/twisting. He notes he has been taking Mucinex at home with mild intermittent relief. Denies fever, chills, body aches, sore throat, hemoptysis, abdominal pain, vomiting, palpitations. Patient notes he was seen at urgent care this morning, chest x-ray showed collapsed lung of right middle lobe resulting in him being sent to the ED for further evaluation. Patient denies any known sick contacts. Denies any recent hospitalizations. Endorses smoking approximately half a pack of cigarettes per day.  Past Medical History:  Diagnosis Date  . Kidney calculi 10/2011   small kidney stone.  Stone analysis not performed  . Migraine   . Tobacco abuse     Patient Active Problem List   Diagnosis Date Noted  . Right foot pain 07/29/2012  . Low back pain 04/17/2012  . Prediabetes 02/03/2012  . Hyperlipidemia 02/03/2012  . 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 Tobacco abuse 12/18/2011  . RESTLESS LEG SYNDROME 07/21/2006  . ESSENTIAL HYPERTENSION 06/09/2006  . ASTHMA 06/09/2006    History reviewed. No pertinent surgical history.     Home  Medications    Prior to Admission medications   Medication Sig Start Date End Date Taking? Authorizing Provider  azithromycin (ZITHROMAX) 250 MG tablet Take 1 tablet (250 mg total) by mouth daily. Take first 2 tablets together, then 1 every day until finished. 02/20/16   Nona Dell, PA-C  benzonatate (TESSALON) 100 MG capsule Take 2 capsules (200 mg total) by mouth 2 (two) times daily as needed for cough. 02/20/16   Nona Dell, PA-C    Family History Family History  Problem Relation Age of Onset  . Diabetes Father   . Lymphoma Father     Non Hodgkins Lymphoma    Social History Social History  Substance Use Topics  . Smoking status: Current Every Day Smoker  . Smokeless tobacco: Never Used  . Alcohol use No     Comment: Occasionally      Allergies   Patient has no known allergies.   Review of Systems Review of Systems  HENT: Positive for congestion and rhinorrhea.   Respiratory: Positive for cough, shortness of breath and wheezing.   Cardiovascular: Positive for chest pain (right side with coughing).  All other systems reviewed and are negative.    Physical Exam Updated Vital Signs BP 129/94   Pulse 78   Temp 98.1 F (36.7 C) (Oral)   Resp 13   Ht 5\' 11"  (1.803 m)   Wt 105.2 kg   SpO2 97%   BMI 32.36 kg/m   Physical Exam  Constitutional: He is oriented to person, place, and time.  He appears well-developed and well-nourished. No distress.  HENT:  Head: Normocephalic and atraumatic.  Right Ear: Tympanic membrane normal.  Left Ear: Tympanic membrane normal.  Nose: Nose normal. Right sinus exhibits no maxillary sinus tenderness and no frontal sinus tenderness. Left sinus exhibits no maxillary sinus tenderness and no frontal sinus tenderness.  Mouth/Throat: Uvula is midline, oropharynx is clear and moist and mucous membranes are normal. No oropharyngeal exudate, posterior oropharyngeal edema, posterior oropharyngeal erythema or tonsillar  abscesses. No tonsillar exudate.  Eyes: Conjunctivae and EOM are normal. Right eye exhibits no discharge. Left eye exhibits no discharge. No scleral icterus.  Neck: Normal range of motion. Neck supple.  Cardiovascular: Normal rate, regular rhythm, normal heart sounds and intact distal pulses.   Pulmonary/Chest: Effort normal. No respiratory distress. He has wheezes. He has rales. He exhibits tenderness (right lateral inferior ribs TTP). He exhibits no laceration, no crepitus, no edema, no deformity, no swelling and no retraction.  Diffuse wheezing and rhonchi noted in bilateral lobes. Pt coughing intermittent throughout exam. No signs of respiratory distress or increased work of breathing.  Abdominal: Soft. Bowel sounds are normal. He exhibits no distension and no mass. There is no tenderness. There is no rebound and no guarding. No hernia.  Musculoskeletal: He exhibits no edema.  Lymphadenopathy:    He has no cervical adenopathy.  Neurological: He is alert and oriented to person, place, and time.  Skin: Skin is warm and dry. He is not diaphoretic.  Nursing note and vitals reviewed.    ED Treatments / Results  Labs (all labs ordered are listed, but only abnormal results are displayed) Labs Reviewed  COMPREHENSIVE METABOLIC PANEL - Abnormal; Notable for the following:       Result Value   Glucose, Bld 106 (*)    All other components within normal limits  CBC WITH DIFFERENTIAL/PLATELET    EKG  EKG Interpretation None       Radiology Dg Chest 1 View  Result Date: 02/20/2016 CLINICAL DATA:  Cough and wheezing for 6 days and shortness of breath after walking around. Flank pain right side under ribs that worsens with movement and coughing. Right lower chest wall pain. EXAM: CHEST 1 VIEW COMPARISON:  Frontal view of the chest with right unilateral ribs study 02/20/2016 and PA and lateral chest radiograph 08/12/2006. FINDINGS: Single lateral view of the chest is submitted for this exam.  There is a wedge-shaped opacity projecting over the heart on the lateral view, most consistent with collapse of the right middle lobe. Negative for pleural effusion. No visible pneumothorax. No fracture identified in the lateral projection. IMPRESSION: Right middle lobe collapse. Right middle lobe consolidation less likely given appearance on the frontal view of the chest on the concurrent rib series. Short-term follow-up chest radiograph to confirm re-expansion of the right middle lobe is recommended. Alternatively, further evaluation with chest CT with contrast could be considered, as indicated. Electronically Signed   By: Curlene Dolphin M.D.   On: 02/20/2016 10:13   Dg Ribs Unilateral W/chest Right  Result Date: 02/20/2016 CLINICAL DATA:  Cough for 6 days, some wheezing and shortness of breath, right lower rib pain EXAM: RIGHT RIBS AND CHEST - 3+ VIEW COMPARISON:  Chest x-ray of 08/12/2006 FINDINGS: No active infiltrate or effusion is seen. Mediastinal and hilar contours are unremarkable. The heart is within normal limits in size. Right rib detail film show no acute right rib fracture. No opaque calculi are seen within the right upper quadrant. IMPRESSION: 1. No  active lung disease. 2. Negative right rib detail. Electronically Signed   By: Ivar Drape M.D.   On: 02/20/2016 10:08    Procedures Procedures (including critical care time)  Medications Ordered in ED Medications  albuterol (PROVENTIL HFA;VENTOLIN HFA) 108 (90 Base) MCG/ACT inhaler 2 puff (not administered)  methylPREDNISolone sodium succinate (SOLU-MEDROL) 125 mg/2 mL injection 125 mg (125 mg Intravenous Given 02/20/16 1242)  albuterol (PROVENTIL) (2.5 MG/3ML) 0.083% nebulizer solution 5 mg (5 mg Nebulization Given 02/20/16 1245)  ipratropium (ATROVENT) nebulizer solution 0.5 mg (0.5 mg Nebulization Given 02/20/16 1245)  albuterol (PROVENTIL) (2.5 MG/3ML) 0.083% nebulizer solution 5 mg (5 mg Nebulization Given 02/20/16 1335)      Initial Impression / Assessment and Plan / ED Course  I have reviewed the triage vital signs and the nursing notes.  Pertinent labs & imaging results that were available during my care of the patient were reviewed by me and considered in my medical decision making (see chart for details).  Clinical Course     Patient presents with shortness of breath with associated cough, wheezing for the past week. Reports initially having nasal congestion and rhinorrhea which has since resolved. Patient was seen at an urgent care prior to arrival, reports his chest x-ray showed collapse of right middle lobe resulting in the patient being advised him to the ED for further evaluation. VSS, oxygen saturation 98% on room air. On exam patient with mild intermittent nonproductive cough, no signs of respiratory distress or increased work of breathing. Lung exam revealed diffuse wheezing and rhonchi bilaterally, mild tenderness over right inferior lateral ribs, no signs of injury or deformity. Remaining exam unremarkable. Patient given steroids and neb treatments. Chart review shows patient's chest x-ray performed prior to arrival with reported right middle lobe collapse. The patient's reported presentation of symptoms I suspect this finding is likely related to community-acquired pneumonia. On reevaluation patient reports significant improvement of his cough and shortness of breath. Reexamination revealed significant improvement of wheezing and rhonchi bilaterally. Patient's oxygen saturation has remained above 95% on room air. Discussed results and plan for discharge with patient. Plan to discharge patient home with albuterol inhaler, antibiotics and antitussive. Patient given information to follow up with PCP outpatient. Discussed return precautions.  Final Clinical Impressions(s) / ED Diagnoses   Final diagnoses:  Community acquired pneumonia of right middle lobe of lung ([place])    New Prescriptions New  Prescriptions   AZITHROMYCIN (ZITHROMAX) 250 MG TABLET    Take 1 tablet (250 mg total) by mouth daily. Take first 2 tablets together, then 1 every day until finished.   BENZONATATE (TESSALON) 100 MG CAPSULE    Take 2 capsules (200 mg total) by mouth 2 (two) times daily as needed for cough.     Chesley Noon [place], [place] 02/20/16 1406    Milton Ferguson, MD 02/20/16 1510

## 2016-02-20 NOTE — ED Notes (Signed)
Pt is in stable condition upon d/c and ambulates from ED. 

## 2016-02-20 NOTE — Progress Notes (Signed)
llow Clear n n Trace 1.025 Neg 5.5

## 2016-02-20 NOTE — Progress Notes (Signed)
By signing my name below, I, Mesha Guinyard, attest that this documentation has been prepared under the direction and in the presence of Merri Ray, MD.  Electronically Signed: Verlee Monte, Medical Scribe. 02/20/16. 9:37 AM.  Subjective:    Patient ID: Todd Weiss, male    DOB: 11-Sep-1970, 45 y.o.   MRN: [id number]  HPI Chief Complaint  Patient presents with  . SIDE PAIN    X 3 days, right    HPI Comments: Todd Weiss is a 45 y.o. male who presents to the [facility] complaining of right side pain onset 3 days ago. Was seen for similar symptoms in 2014 at [facility]. Hx of nephrolithiasis, and kidney stones. He did have moderate blood on urin analysis. Treated with hydrocodone and zofran, and recommended urology follow-up.  Pt had a cough for the past 6 days with wheezing, and SOB after walking around, but it has been improving. Right side pain located by his ribs and worsens with movement, and coughing. Pt was laying in the bed at night when felt a pop when the pain occured. Reports associated symptoms of diaphoresis. Took cough drops, biofreeze without relief, aleve, and mucinex. Pt stopped smoking a month ago. Denies hematuria, difficulty urinating, injury to his ribs, and fever. Denies PMHx of COPD, or asthma.   Patient Active Problem List   Diagnosis Date Noted  . Right foot pain 07/29/2012  . Low back pain 04/17/2012  . Prediabetes 02/03/2012  . Hyperlipidemia 02/03/2012  . 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 Tobacco abuse 12/18/2011  . RESTLESS LEG SYNDROME 07/21/2006  . ESSENTIAL HYPERTENSION 06/09/2006  . ASTHMA 06/09/2006   Past Medical History:  Diagnosis Date  . Kidney calculi 10/2011   small kidney stone.  Stone analysis not performed  . Migraine   . Tobacco abuse    No past surgical history on file. No Known Allergies Prior to Admission medications   Not on File   Social History   Social History  . Marital status:  Single    Spouse name: N/A  . Number of children: N/A  . Years of education: N/A   Occupational History  . Not on file.   Social History Main Topics  . Smoking status: Current Every Day Smoker  . Smokeless tobacco: Never Used  . Alcohol use No  . Drug use: No  . Sexual activity: Not on file   Other Topics Concern  . Not on file   Social History Narrative  . No narrative on file   Review of Systems  Constitutional: Positive for diaphoresis. Negative for fever.  Respiratory: Positive for cough, shortness of breath and wheezing.   Cardiovascular: Positive for chest pain (right side chest wall).  Genitourinary: Negative for difficulty urinating and hematuria.  Neurological: Negative for dizziness and light-headedness.   Objective:  Physical Exam  Constitutional: He is oriented to person, place, and time. He appears well-developed and well-nourished.  HENT:  Head: Normocephalic and atraumatic.  Right Ear: Tympanic membrane, external ear and ear canal normal.  Left Ear: Tympanic membrane, external ear and ear canal normal.  Nose: No rhinorrhea.  Mouth/Throat: Oropharynx is clear and moist and mucous membranes are normal. No oropharyngeal exudate or posterior oropharyngeal erythema.  Eyes: Conjunctivae are normal. Pupils are equal, round, and reactive to light.  Neck: Neck supple.  Cardiovascular: Normal rate, regular rhythm, normal heart sounds and intact distal pulses.   No murmur heard. Pulmonary/Chest: Effort normal. He has wheezes (  Diffuse inspiratory and expiratory). He has no rhonchi. He has no rales.  Abdominal: Soft. There is no tenderness.  Tender along the right lower rib margin  Lymphadenopathy:    He has no cervical adenopathy.  Neurological: He is alert and oriented to person, place, and time.  Skin: Skin is warm and dry. No rash noted.  Psychiatric: He has a normal mood and affect. His behavior is normal.  Vitals reviewed.  BP 118/80 (BP Location: Left Arm,  Patient Position: Sitting, Cuff Size: Large)   Pulse 90   Temp 97.6 F (36.4 C) (Oral)   Resp 16   Ht 5\' 11"  (1.803 m)   Wt 232 lb (105.2 kg)   SpO2 95%   BMI 32.36 kg/m    Results for orders placed or performed in visit on 02/20/16  POCT urinalysis dipstick  Result Value Ref Range   Color, UA yellow yellow   Clarity, UA clear clear   Glucose, UA negative negative   Bilirubin, UA negative negative   Ketones, POC UA trace (5) (A) negative   Spec Grav, UA 1.025    Blood, UA negative negative   pH, UA 5.5    Protein Ur, POC trace (A) negative   Urobilinogen, UA 0.2    Nitrite, UA Negative Negative   Leukocytes, UA Negative Negative  POCT Microscopic Urinalysis ([facility])  Result Value Ref Range   WBC,UR,HPF,POC None None WBC/hpf   RBC,UR,HPF,POC None None RBC/hpf   Bacteria None None, Too numerous to count   Mucus Absent Absent   Epithelial Cells, UR Per Microscopy None None, Too numerous to count cells/hpf   Dg Chest 1 View  Result Date: 02/20/2016 CLINICAL DATA:  Cough and wheezing for 6 days and shortness of breath after walking around. Flank pain right side under ribs that worsens with movement and coughing. Right lower chest wall pain. EXAM: CHEST 1 VIEW COMPARISON:  Frontal view of the chest with right unilateral ribs study 02/20/2016 and PA and lateral chest radiograph 08/12/2006. FINDINGS: Single lateral view of the chest is submitted for this exam. There is a wedge-shaped opacity projecting over the heart on the lateral view, most consistent with collapse of the right middle lobe. Negative for pleural effusion. No visible pneumothorax. No fracture identified in the lateral projection. IMPRESSION: Right middle lobe collapse. Right middle lobe consolidation less likely given appearance on the frontal view of the chest on the concurrent rib series. Short-term follow-up chest radiograph to confirm re-expansion of the right middle lobe is recommended. Alternatively, further  evaluation with chest CT with contrast could be considered, as indicated. Electronically Signed   By: Curlene Dolphin M.D.   On: 02/20/2016 10:13   Dg Ribs Unilateral W/chest Right  Result Date: 02/20/2016 CLINICAL DATA:  Cough for 6 days, some wheezing and shortness of breath, right lower rib pain EXAM: RIGHT RIBS AND CHEST - 3+ VIEW COMPARISON:  Chest x-ray of 08/12/2006 FINDINGS: No active infiltrate or effusion is seen. Mediastinal and hilar contours are unremarkable. The heart is within normal limits in size. Right rib detail film show no acute right rib fracture. No opaque calculi are seen within the right upper quadrant. IMPRESSION: 1. No active lung disease. 2. Negative right rib detail. Electronically Signed   By: Ivar Drape M.D.   On: 02/20/2016 10:08   [10:18 AM] Reports less SOB after nebulizer treatment, but still feels pain. After with nebulizer treatment, still with persistent diffuse wheeze, expiratory> inspiratory. Mentions he drove to work with  his SOB and would like to drive himself to the hospital since his mom can't drive. Denies light-headedness, dizziness, and PMHx of collapsed lung. 95-96% O2 stat   Assessment & Plan:   Todd Weiss is a 45 y.o. male Right flank pain - Plan: POCT urinalysis dipstick, POCT Microscopic Urinalysis ([facility]), DG Ribs Unilateral W/Chest Right  Right-sided chest wall pain - Plan: DG Ribs Unilateral W/Chest Right  Cough - Plan: DG Chest 1 View, DG Ribs Unilateral W/Chest Right  Wheezing - Plan: albuterol (PROVENTIL) (2.5 MG/3ML) 0.083% nebulizer solution 2.5 mg, ipratropium (ATROVENT) nebulizer solution 0.5 mg  History of tobacco abuse - Plan: albuterol (PROVENTIL) (2.5 MG/3ML) 0.083% nebulizer solution 2.5 mg, ipratropium (ATROVENT) nebulizer solution 0.5 mg  Collapse of right lung  Six-day history of cough, but 3 days ago with acute pain/pop in the right side of his chest with persistent right-sided chest wall pain. Has had some  persistent dyspnea for 6 days without reported a change in the past 3 days. Also persistent wheezing for the past 6 days with history of tobacco abuse. Subjectively somewhat improved with dyspnea after albuterol and Atrovent neb 1, but still persistent wheezing. Chest x-ray significant for collapse or suspected collapse of right middle lobe. No other signs of pneumothorax noted. O2 sat repeated and verified approximately 96% on room air.   - Discussed EMS transport, but was declined. Will transport himself by private vehicle. Risks were discussed as well as 911 precautions if any symptom worsening on the way to emergency room. Triage nurse advised at [facility] ordered this encounter  Medications  . albuterol (PROVENTIL) (2.5 MG/3ML) 0.083% nebulizer solution 2.5 mg  . ipratropium (ATROVENT) nebulizer solution 0.5 mg   Patient Instructions       IF you received an x-ray today, you will receive an invoice from [facility] Radiology. Please contact [facility] Radiology at [phone] with questions or concerns regarding your invoice.   IF you received labwork today, you will receive an invoice from [employer]. Please contact [facility] at [phone] with questions or concerns regarding your invoice.   Our billing staff will not be able to assist you with questions regarding bills from these companies.  You will be contacted with the lab results as soon as they are available. The fastest way to get your results is to activate your My Chart account. Instructions are located on the last page of this paperwork. If you have not heard from [place] regarding the results in 2 weeks, please contact this office.        I personally performed the services described in this documentation, which was scribed in my presence. The recorded information has been reviewed and considered, and addended by me as needed.   Signed,   Merri Ray, MD [facility].  02/20/16 10:32 AM

## 2016-02-20 NOTE — ED Triage Notes (Signed)
Per Pt, Pt is coming from home with complaints of SOB, productive cough and congestion x3days. Pt reports going to UC and being sent down here for a collapsed lung on the right side. Denies fevers, but reports sweating. Denies Chest pain

## 2016-02-20 NOTE — Discharge Instructions (Signed)
Take your antibiotic as prescribed until completed. I recommend using your albuterol inhaler as prescribed as needed for wheezing or shortness of breath. Continue drinking fluids at home to remain hydrated. I recommend refrain from smoking as this may worsen your symptoms or prolong your illness. Please follow up with a primary care provider from the Resource Guide provided below in 1 week if her symptoms have not improved. Please return to the Emergency Department if symptoms worsen or new onset of fever, coughing up blood, shortness of breath/difficulty breathing, chest pain, abdominal pain, vomiting, unable to keep fluids down.

## 2016-02-20 NOTE — Patient Instructions (Addendum)
  In addition to the wheezing that has persisted after nebulizer treatment, your x-ray did indicate a collapse of your right middle lobe of your lung. This needs to be evaluated in the emergency room, go there directly after leaving our office. If you have any increase in shortness of breath, lightheadedness, dizziness, or any new or worsening symptoms, pull over and call 911.     IF you received an x-ray today, you will receive an invoice from [facility] Radiology. Please contact [facility] Radiology at [phone] with questions or concerns regarding your invoice.   IF you received labwork today, you will receive an invoice from [employer]. Please contact [facility] at [phone] with questions or concerns regarding your invoice.   Our billing staff will not be able to assist you with questions regarding bills from these companies.  You will be contacted with the lab results as soon as they are available. The fastest way to get your results is to activate your My Chart account. Instructions are located on the last page of this paperwork. If you have not heard from [place] regarding the results in 2 weeks, please contact this office.

## 2016-04-12 ENCOUNTER — Emergency Department (HOSPITAL_COMMUNITY)
Admission: EM | Admit: 2016-04-12 | Discharge: 2016-04-12 | Disposition: A | Discharge status: Home / Self Care | Payer: BLUE CROSS/BLUE SHIELD | Attending: Emergency Medicine | Admitting: Emergency Medicine

## 2016-04-12 ENCOUNTER — Emergency Department (HOSPITAL_COMMUNITY): Payer: BLUE CROSS/BLUE SHIELD

## 2016-04-12 ENCOUNTER — Encounter (HOSPITAL_COMMUNITY): Payer: Self-pay | Admitting: Emergency Medicine

## 2016-04-12 DIAGNOSIS — R221 Localized swelling, mass and lump, neck: Secondary | ICD-10-CM | POA: Diagnosis not present

## 2016-04-12 DIAGNOSIS — J45909 Unspecified asthma, uncomplicated: Secondary | ICD-10-CM | POA: Insufficient documentation

## 2016-04-12 DIAGNOSIS — I10 Essential (primary) hypertension: Secondary | ICD-10-CM | POA: Diagnosis not present

## 2016-04-12 DIAGNOSIS — F17200 Nicotine dependence, unspecified, uncomplicated: Secondary | ICD-10-CM | POA: Diagnosis not present

## 2016-04-12 DIAGNOSIS — N201 Calculus of ureter: Secondary | ICD-10-CM | POA: Insufficient documentation

## 2016-04-12 DIAGNOSIS — R1031 Right lower quadrant pain: Secondary | ICD-10-CM | POA: Diagnosis present

## 2016-04-12 DIAGNOSIS — N200 Calculus of kidney: Secondary | ICD-10-CM

## 2016-04-12 LAB — URINALYSIS, ROUTINE W REFLEX MICROSCOPIC
BACTERIA UA: NONE SEEN
Bilirubin Urine: NEGATIVE
GLUCOSE, UA: NEGATIVE mg/dL
KETONES UR: NEGATIVE mg/dL
LEUKOCYTES UA: NEGATIVE
NITRITE: NEGATIVE
PH: 5 (ref 5.0–8.0)
Protein, ur: NEGATIVE mg/dL
Specific Gravity, Urine: 1.018 (ref 1.005–1.030)

## 2016-04-12 LAB — CBC WITH DIFFERENTIAL/PLATELET
Basophils Absolute: 0 10*3/uL (ref 0.0–0.1)
Basophils Relative: 0 %
EOS PCT: 3 %
Eosinophils Absolute: 0.3 10*3/uL (ref 0.0–0.7)
HEMATOCRIT: 41.1 % (ref 39.0–52.0)
Hemoglobin: 14.1 g/dL (ref 13.0–17.0)
LYMPHS PCT: 18 %
Lymphs Abs: 1.7 10*3/uL (ref 0.7–4.0)
MCH: 28.5 pg (ref 26.0–34.0)
MCHC: 34.3 g/dL (ref 30.0–36.0)
MCV: 83 fL (ref 78.0–100.0)
MONO ABS: 0.6 10*3/uL (ref 0.1–1.0)
MONOS PCT: 6 %
NEUTROS ABS: 7.3 10*3/uL (ref 1.7–7.7)
Neutrophils Relative %: 73 %
PLATELETS: 239 10*3/uL (ref 150–400)
RBC: 4.95 MIL/uL (ref 4.22–5.81)
RDW: 13.2 % (ref 11.5–15.5)
WBC: 9.9 10*3/uL (ref 4.0–10.5)

## 2016-04-12 LAB — BASIC METABOLIC PANEL
Anion gap: 7 (ref 5–15)
BUN: 19 mg/dL (ref 6–20)
CALCIUM: 8.9 mg/dL (ref 8.9–10.3)
CO2: 25 mmol/L (ref 22–32)
CREATININE: 1.06 mg/dL (ref 0.61–1.24)
Chloride: 106 mmol/L (ref 101–111)
GFR calc Af Amer: >60 mL/min (ref >60)
GFR calc non Af Amer: >60 mL/min (ref >60)
GLUCOSE: 126 mg/dL — AB (ref 65–99)
Potassium: 4.6 mmol/L (ref 3.5–5.1)
Sodium: 138 mmol/L (ref 135–145)

## 2016-04-12 MED ORDER — HYDROCODONE-ACETAMINOPHEN 5-325 MG PO TABS: 1.0000 | ORAL_TABLET | Freq: Four times a day (QID) | ORAL | 0 refills | Status: DC | PRN

## 2016-04-12 MED ORDER — TAMSULOSIN HCL 0.4 MG PO CAPS: 0.4000 mg | ORAL_CAPSULE | Freq: Every day | ORAL | 0 refills | Status: DC

## 2016-04-12 MED ORDER — SODIUM CHLORIDE 0.9 % IV BOLUS (SEPSIS)
1000.0000 mL | Freq: Once | INTRAVENOUS | Status: AC
  Administered 2016-04-12: 1000 mL via INTRAVENOUS

## 2016-04-12 MED ORDER — KETOROLAC TROMETHAMINE 30 MG/ML IJ SOLN
15.0000 mg | Freq: Once | INTRAMUSCULAR | Status: AC
  Administered 2016-04-12: 15 mg via INTRAVENOUS
  Filled 2016-04-12: qty 1

## 2016-04-12 NOTE — ED Triage Notes (Signed)
Per EMS pt complaint of worsening right flank pain onset 2000 last night; hx of kidney stones. Pt given 100 mcg fentanyl en route with EMS.

## 2016-04-12 NOTE — ED Notes (Signed)
Pt given urinal and reminded of need for urine

## 2016-04-12 NOTE — ED Provider Notes (Signed)
[facility] DEPT Provider Note   CSN: [id number] Arrival date & time: 04/12/16  0827     History   Chief Complaint Chief Complaint  Patient presents with  . Flank Pain    HPI Todd Weiss is a 46 y.o. male.  HPI patient presents with concern of new right flank pain.  Patient awoke with pain overnight, over the past 10 hours. Since onset the pain has been persistent, though with waxing, waning severity. Pain is focally in the right flank, with occasional episodes in the suprapubic region.  There is associated nausea, no vomiting, no diarrhea, no dysuria, no hematuria. Patient acknowledges history of prior kidney stone, no recent evaluation, or imaging. No relief with anything during this illness. Patient is here to family members corroborate the history of present illness.   Past Medical History:  Diagnosis Date  . Kidney calculi 10/2011   small kidney stone.  Stone analysis not performed  . Migraine   . Tobacco abuse     Patient Active Problem List   Diagnosis Date Noted  . Right foot pain 07/29/2012  . Low back pain 04/17/2012  . Prediabetes 02/03/2012  . Hyperlipidemia 02/03/2012  . 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 Tobacco abuse 12/18/2011  . RESTLESS LEG SYNDROME 07/21/2006  . ESSENTIAL HYPERTENSION 06/09/2006  . ASTHMA 06/09/2006    History reviewed. No pertinent surgical history.     Home Medications    Prior to Admission medications   Medication Sig Start Date End Date Taking? Authorizing Provider  azithromycin (ZITHROMAX) 250 MG tablet Take 1 tablet (250 mg total) by mouth daily. Take first 2 tablets together, then 1 every day until finished. Patient not taking: Reported on 04/12/2016 02/20/16   Nona Dell, PA-C  benzonatate (TESSALON) 100 MG capsule Take 2 capsules (200 mg total) by mouth 2 (two) times daily as needed for cough. Patient not taking: Reported on 04/12/2016 02/20/16   Nona Dell, PA-C    Family  History Family History  Problem Relation Age of Onset  . Diabetes Father   . Lymphoma Father     Non Hodgkins Lymphoma    Social History Social History  Substance Use Topics  . Smoking status: Current Every Day Smoker  . Smokeless tobacco: Never Used  . Alcohol use No     Comment: Occasionally      Allergies   Patient has no known allergies.   Review of Systems Review of Systems  Constitutional:       Per HPI, otherwise negative  HENT:       Per HPI, otherwise negative  Respiratory:       Per HPI, otherwise negative  Cardiovascular:       Per HPI, otherwise negative  Gastrointestinal: Negative for vomiting.  Endocrine:       Negative aside from HPI  Genitourinary:       Neg aside from HPI   Musculoskeletal:       Per HPI, otherwise negative  Skin: Negative.   Neurological: Negative for syncope.     Physical Exam Updated Vital Signs BP (!) 155/122 (BP Location: Left Arm)   Pulse 67   Temp 98.1 F (36.7 C) (Oral)   Resp 18   Ht 6\' 1"  (1.854 m)   Wt 232 lb (105.2 kg)   SpO2 96%   BMI 30.61 kg/m   Physical Exam  Constitutional: He is oriented to person, place, and time. He appears well-developed. No distress.  HENT:  Head: Normocephalic and  atraumatic.  Eyes: Conjunctivae and EOM are normal.  Cardiovascular: Normal rate and regular rhythm.   Pulmonary/Chest: Effort normal. No stridor. No respiratory distress.  Abdominal: He exhibits no distension. There is no tenderness.  Musculoskeletal: He exhibits no edema.  Mild tenderness to palpation about the right flank  Neurological: He is alert and oriented to person, place, and time.  Skin: Skin is warm and dry.     Psychiatric: He has a normal mood and affect.  Nursing note and vitals reviewed.    ED Treatments / Results  Labs (all labs ordered are listed, but only abnormal results are displayed) Labs Reviewed  URINALYSIS, ROUTINE W REFLEX MICROSCOPIC - Abnormal; Notable for the following:        Result Value   Hgb urine dipstick LARGE (*)    Squamous Epithelial / LPF 0-5 (*)    All other components within normal limits  BASIC METABOLIC PANEL - Abnormal; Notable for the following:    Glucose, Bld 126 (*)    All other components within normal limits  CBC WITH DIFFERENTIAL/PLATELET     Radiology Ct Renal Stone Study  Result Date: 04/12/2016 CLINICAL DATA:  Right-sided pain. EXAM: CT ABDOMEN AND PELVIS WITHOUT CONTRAST TECHNIQUE: Multidetector CT imaging of the abdomen and pelvis was performed following the standard protocol without IV contrast. COMPARISON:  None. FINDINGS: Lower chest: No acute abnormality. 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 Hepatobiliary: No focal liver abnormality is seen. No gallstones, gallbladder wall thickening, or biliary dilatation. Pancreas: Unremarkable. No pancreatic ductal dilatation or surrounding inflammatory changes. Spleen: Normal in size without focal abnormality. Adrenals/Urinary Tract: Adrenal glands are unremarkable. 2 mm distal right ureteral calculus resulting in mild right hydroureteronephrosis and right perinephric stranding. Multiple bilateral nonobstructing renal calculi. Bladder is unremarkable. Stomach/Bowel: Stomach is within normal limits. Appendix appears normal. No evidence of bowel wall thickening, distention, or inflammatory changes. Vascular/Lymphatic: Abdominal aortic atherosclerosis. No lymphadenopathy. Reproductive: Uterus and bilateral adnexa are unremarkable. Other: No fluid collection or hematoma. Musculoskeletal: Mild degenerative disc disease with disc height loss at L5-S1. IMPRESSION: 1. 2 mm distal right ureteral calculus resulting in mild right hydroureteronephrosis and right perinephric stranding. 2. 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 Electronically Signed    By: Kathreen Devoid   On: 04/12/2016 10:38    Procedures Procedures (including critical care time)  Medications Ordered in ED Medications  ketorolac (TORADOL) 30 MG/ML injection 15 mg (not administered)  sodium chloride 0.9 % bolus 1,000 mL (not administered)     Initial Impression / Assessment and Plan / ED Course  I have reviewed the triage vital signs and the nursing notes.  Pertinent labs & imaging results that were available during my care of the patient were reviewed by me and considered in my medical decision making (see chart for details).  Clinical Course     Patient much better on repeat exam. With his family we had a lengthy conversation about findings, including stone, absence of infection or obstruction. Patient will be discharged to follow up with urology. Patient was provided referral to dermatology for his left posterior neck cyst.  Final Clinical Impressions(s) / ED Diagnoses   Final diagnoses:  Kidney stone  neck cyst  New Prescriptions New Prescriptions   HYDROCODONE-ACETAMINOPHEN (NORCO/VICODIN) 5-325 MG TABLET    Take 1 tablet by mouth every 6 (six) hours as  needed for severe pain.   TAMSULOSIN (FLOMAX) 0.4 MG CAPS CAPSULE    Take 1 capsule (0.4 mg total) by mouth daily.     Carmin Muskrat, MD 04/12/16 1220

## 2016-04-12 NOTE — ED Notes (Signed)
Pt attempting urinalysis at present time.

## 2016-04-12 NOTE — ED Notes (Signed)
Pt states unable to provide sample at present time.

## 2016-04-12 NOTE — ED Notes (Signed)
Pt is aware we need a urine sample.

## 2016-04-12 NOTE — ED Notes (Signed)
Patient transported to CT 

## 2017-12-09 IMAGING — DX DG CHEST 1V
1 series · 1 of 1 positions shown · non-contrast
Comparison: Frontal view of the chest with right unilateral ribs
study 02/20/2016 and PA and lateral chest radiograph 08/12/2006.

CLINICAL DATA: Cough and wheezing for 6 days and shortness of
breath after walking around. Flank pain right side under ribs that
worsens with movement and coughing. Right lower chest wall pain.

EXAM:
CHEST 1 VIEW

[chest pa]
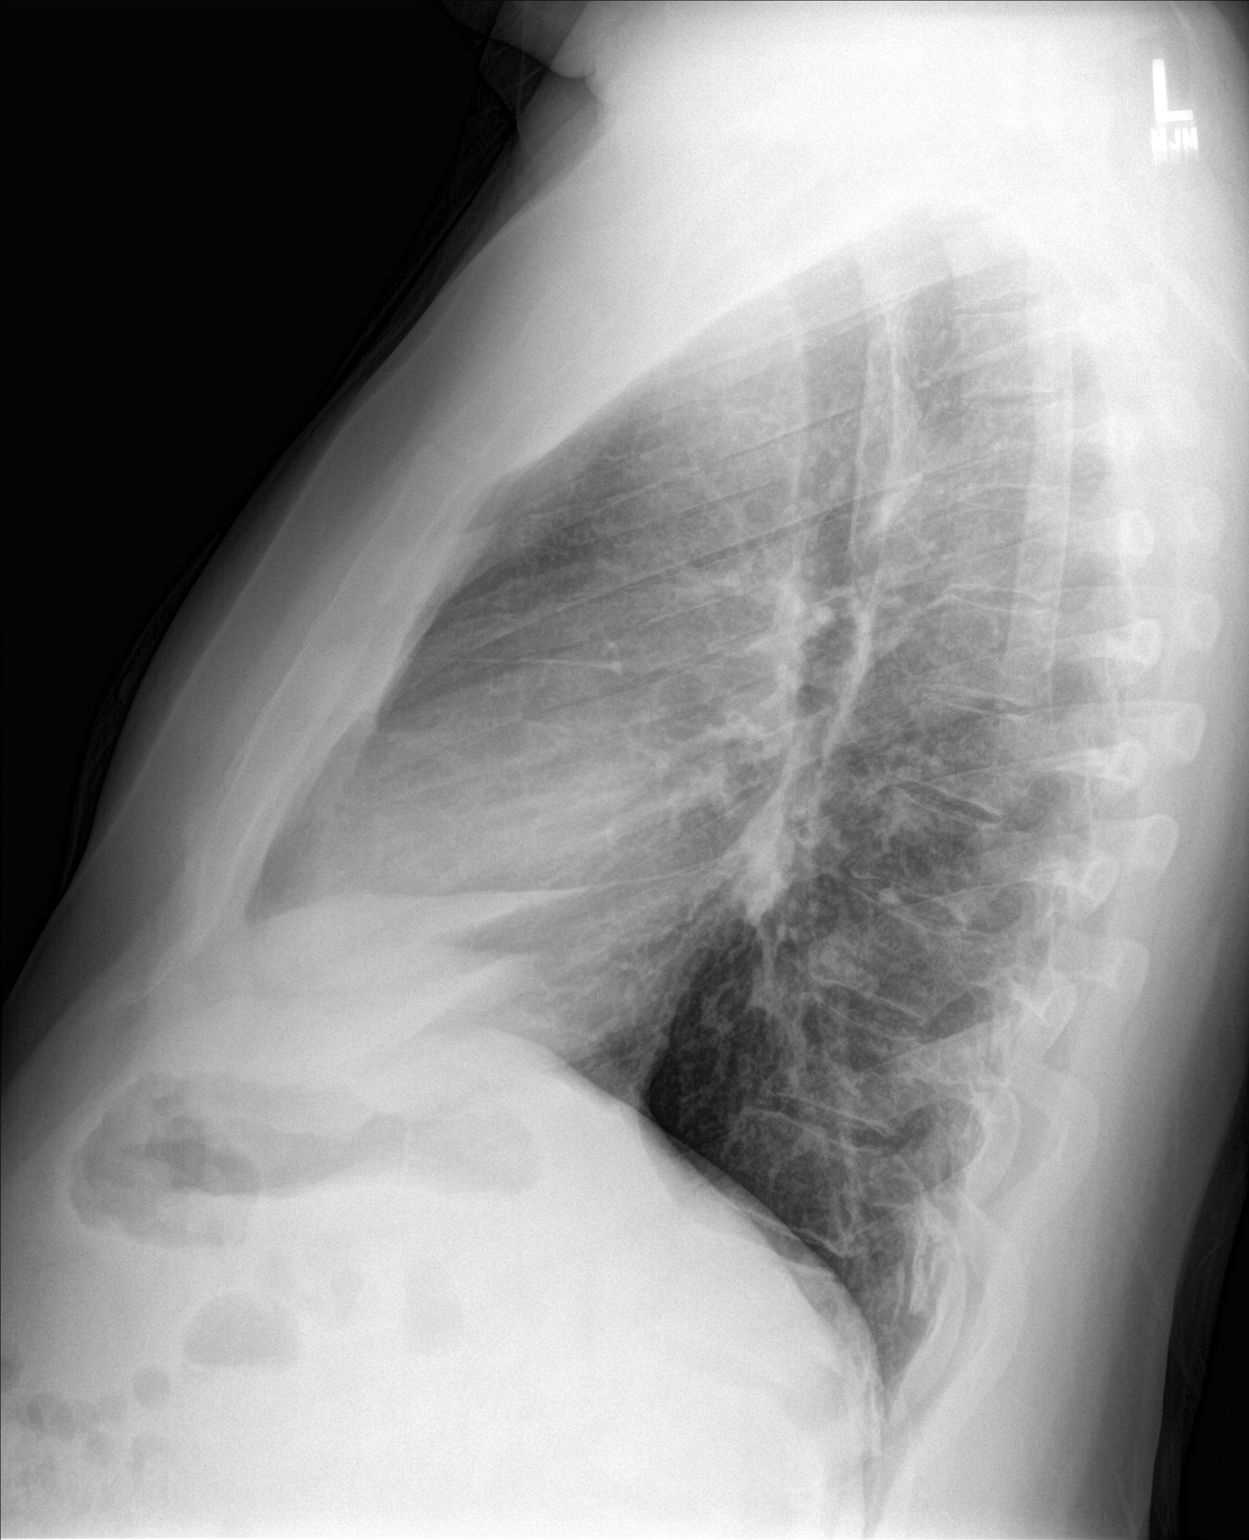

[1 of 1 positions shown; findings below may reference images not displayed]

FINDINGS: Single lateral view of the chest is submitted for this exam. There
is a wedge-shaped opacity projecting over the heart on the lateral
view, most consistent with collapse of the right middle lobe.
Negative for pleural effusion. No visible pneumothorax. No fracture
identified in the lateral projection.
IMPRESSION: Right middle lobe collapse. Right middle lobe consolidation less
likely given appearance on the frontal view of the chest on the
concurrent rib series. Short-term follow-up chest radiograph to
confirm re-expansion of the right middle lobe is recommended.
Alternatively, further evaluation with chest CT with contrast could
be considered, as indicated.

## 2018-01-05 ENCOUNTER — Ambulatory Visit (HOSPITAL_COMMUNITY)
Admission: EM | Admit: 2018-01-05 | Discharge: 2018-01-05 | Disposition: A | Discharge status: Home / Self Care | Payer: BLUE CROSS/BLUE SHIELD

## 2018-01-05 ENCOUNTER — Encounter (HOSPITAL_COMMUNITY): Payer: Self-pay | Admitting: Emergency Medicine

## 2018-01-05 ENCOUNTER — Ambulatory Visit: Payer: Self-pay | Admitting: Physician Assistant

## 2018-01-05 ENCOUNTER — Other Ambulatory Visit: Payer: Self-pay

## 2018-01-05 DIAGNOSIS — M5431 Sciatica, right side: Secondary | ICD-10-CM

## 2018-01-05 DIAGNOSIS — S39012A Strain of muscle, fascia and tendon of lower back, initial encounter: Secondary | ICD-10-CM

## 2018-01-05 MED ORDER — CYCLOBENZAPRINE HCL 5 MG PO TABS: 5.0000 mg | ORAL_TABLET | Freq: Two times a day (BID) | ORAL | 0 refills | Status: DC | PRN

## 2018-01-05 MED ORDER — METHYLPREDNISOLONE 4 MG PO TBPK: ORAL_TABLET | ORAL | 0 refills | Status: DC

## 2018-01-05 NOTE — ED Provider Notes (Signed)
[place]    CSN: [id number] Arrival date & time: 01/05/18  0820     History   Chief Complaint Chief Complaint  Patient presents with  . Back Pain    HPI Todd Weiss is a 47 y.o. male.   Todd Weiss presents with complaints of low back pain, worse on the right, which radiates into buttock and upper thigh. Woke with it this morning. No numbness tingling or weakness. No saddle paresthesia. No loss of bladder or bowel. Ambulatory. No specific trigger or injury. Has had similar in the past. Took two aleve early this morning which did not help. Worse with activity and improves with rest. Pain 8/10. Doesn't follow with a PCP regularly. Hx of kidney stones, doesn't feel like this. Hx of migraines.     ROS per HPI.      Past Medical History:  Diagnosis Date  . Kidney calculi 10/2011   small kidney stone.  Stone analysis not performed  . Migraine   . Tobacco abuse     Patient Active Problem List   Diagnosis Date Noted  . Right foot pain 07/29/2012  . Low back pain 04/17/2012  . Prediabetes 02/03/2012  . Hyperlipidemia 02/03/2012  . 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 Tobacco abuse 12/18/2011  . RESTLESS LEG SYNDROME 07/21/2006  . ESSENTIAL HYPERTENSION 06/09/2006  . ASTHMA 06/09/2006    History reviewed. No pertinent surgical history.     Home Medications    Prior to Admission medications   Medication Sig Start Date End Date Taking? Authorizing Provider  naproxen sodium (ALEVE) 220 MG tablet Take 220 mg by mouth.   Yes [provider]  cyclobenzaprine (FLEXERIL) 5 MG tablet Take 1 tablet (5 mg total) by mouth 2 (two) times daily as needed for muscle spasms. 01/05/18   Zigmund Gottron, NP  methylPREDNISolone (MEDROL DOSEPAK) 4 MG TBPK tablet Per box instructions 01/05/18   Zigmund Gottron, NP    Family History Family History  Problem Relation Age of Onset  . Diabetes Father   . Lymphoma Father        Non Hodgkins Lymphoma    Social  History Social History   Tobacco Use  . Smoking status: Current Every Day Smoker  . Smokeless tobacco: Never Used  Substance Use Topics  . Alcohol use: No    Comment: Occasionally   . Drug use: No     Allergies   Patient has no known allergies.   Review of Systems Review of Systems   Physical Exam Triage Vital Signs ED Triage Vitals  Enc Vitals Group     BP 01/05/18 0831 (!) 140/100     Pulse Rate 01/05/18 0831 79     Resp 01/05/18 0831 18     Temp 01/05/18 0831 98.2 F (36.8 C)     Temp Source 01/05/18 0831 Oral     SpO2 01/05/18 0831 97 %     Weight --      Height --      Head Circumference --      Peak Flow --      Pain Score 01/05/18 0835 8     Pain Loc --      Pain Edu? --      Excl. in [place]? --    No data found.  Updated Vital Signs BP (!) 140/100 (BP Location: Left Arm)   Pulse 79   Temp 98.2 F (36.8 C) (Oral)   Resp 18   SpO2 97%  Visual Acuity Right Eye Distance:   Left Eye Distance:   Bilateral Distance:    Right Eye Near:   Left Eye Near:    Bilateral Near:     Physical Exam  Constitutional: He is oriented to person, place, and time. He appears well-developed and well-nourished.  Cardiovascular: Normal rate and regular rhythm.  Pulmonary/Chest: Effort normal and breath sounds normal.  Musculoskeletal:       Left hip: Normal.       Lumbar back: He exhibits decreased range of motion, tenderness and pain. He exhibits no bony tenderness, no swelling, no edema, no deformity, no laceration, no spasm and normal pulse.  Right low back pain with palpation and with transition from sit to lay and lay to sit; pain with right hip flexion and with right straight leg raise; radiation to buttocks with straight leg raise; ambulatory; strength equal bilaterally; gross sensation intact to lower extremities; no spinous process tenderness step off or deformity.   Neurological: He is alert and oriented to person, place, and time.  Skin: Skin is warm and dry.       UC Treatments / Results  Labs (all labs ordered are listed, but only abnormal results are displayed) Labs Reviewed - No data to display  EKG None  Radiology No results found.  Procedures Procedures (including critical care time)  Medications Ordered in UC Medications - No data to display  Initial Impression / Assessment and Plan / UC Course  I have reviewed the triage vital signs and the nursing notes.  Pertinent labs & imaging results that were available during my care of the patient were reviewed by me and considered in my medical decision making (see chart for details).     Right sided sciatica. Medrol pack and flexeril provided. Exercises provided and encouraged as tolerated. If symptoms worsen or do not improve in the next week to return to be seen or to follow up with PCP.  Patient verbalized understanding and agreeable to plan.   Final Clinical Impressions(s) / UC Diagnoses   Final diagnoses:  Strain of lumbar region, initial encounter  Sciatica of right side     Discharge Instructions     Light and regular activity as tolerated.  Medrol dosepak as prescribed.  Flexeril as needed for muscle spasms, may be most helpful at night. May cause drowsiness. Please do not take if driving or drinking alcohol.   Please establish with a primary care provider if symptoms persist as may need further evaluation and treatment.    ED Prescriptions    Medication Sig Dispense Auth. Provider   methylPREDNISolone (MEDROL DOSEPAK) 4 MG TBPK tablet Per box instructions 21 tablet Rozanne Heumann B, NP   cyclobenzaprine (FLEXERIL) 5 MG tablet Take 1 tablet (5 mg total) by mouth 2 (two) times daily as needed for muscle spasms. 15 tablet Zigmund Gottron, NP     Controlled Substance Prescriptions [place] Controlled Substance Registry consulted? Not Applicable   Zigmund Gottron, NP 01/05/18 [phone]

## 2018-01-05 NOTE — ED Triage Notes (Signed)
Lower back pain that started yesterday.  No known injury.  Pain radiates into right leg

## 2018-01-05 NOTE — Discharge Instructions (Signed)
Light and regular activity as tolerated.  Medrol dosepak as prescribed.  Flexeril as needed for muscle spasms, may be most helpful at night. May cause drowsiness. Please do not take if driving or drinking alcohol.   Please establish with a primary care provider if symptoms persist as may need further evaluation and treatment.

## 2018-06-02 ENCOUNTER — Encounter (HOSPITAL_COMMUNITY): Payer: Self-pay | Admitting: Emergency Medicine

## 2018-06-02 ENCOUNTER — Other Ambulatory Visit: Payer: Self-pay

## 2018-06-02 ENCOUNTER — Ambulatory Visit (HOSPITAL_COMMUNITY)
Admission: EM | Admit: 2018-06-02 | Discharge: 2018-06-02 | Disposition: A | Discharge status: Home / Self Care | Payer: Self-pay | Attending: Family Medicine | Admitting: Family Medicine

## 2018-06-02 DIAGNOSIS — K0889 Other specified disorders of teeth and supporting structures: Secondary | ICD-10-CM

## 2018-06-02 DIAGNOSIS — K047 Periapical abscess without sinus: Secondary | ICD-10-CM

## 2018-06-02 MED ORDER — IBUPROFEN 800 MG PO TABS: 800.0000 mg | ORAL_TABLET | Freq: Three times a day (TID) | ORAL | 0 refills | Status: DC

## 2018-06-02 MED ORDER — HYDROCODONE-ACETAMINOPHEN 7.5-325 MG PO TABS: 1.0000 | ORAL_TABLET | Freq: Four times a day (QID) | ORAL | 0 refills | Status: DC | PRN

## 2018-06-02 MED ORDER — AMOXICILLIN 500 MG PO CAPS: 500.0000 mg | ORAL_CAPSULE | Freq: Three times a day (TID) | ORAL | 0 refills | Status: DC

## 2018-06-02 NOTE — ED Provider Notes (Signed)
[place]    CSN: [id number] Arrival date & time: 06/02/18  1630     History   Chief Complaint Chief Complaint  Patient presents with  . Dental Pain    HPI Todd Weiss is a 48 y.o. male.   HPI  Dental pain for 3 days. He has redness and swelling of his gums. Pain in the lower molars on the right and some swollen glands under the right jaw. He states the pain is "severe".  Past Medical History:  Diagnosis Date  . Kidney calculi 10/2011   small kidney stone.  Stone analysis not performed  . Migraine   . Tobacco abuse     Patient Active Problem List   Diagnosis Date Noted  . Right foot pain 07/29/2012  . Low back pain 04/17/2012  . Prediabetes 02/03/2012  . Hyperlipidemia 02/03/2012  . 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 Tobacco abuse 12/18/2011  . RESTLESS LEG SYNDROME 07/21/2006  . ESSENTIAL HYPERTENSION 06/09/2006  . ASTHMA 06/09/2006    History reviewed. No pertinent surgical history.     Home Medications    Prior to Admission medications   Medication Sig Start Date End Date Taking? Authorizing Provider  amoxicillin (AMOXIL) 500 MG capsule Take 1 capsule (500 mg total) by mouth 3 (three) times daily. 06/02/18   Raylene Everts, MD  HYDROcodone-acetaminophen ([place]) 7.5-325 MG tablet Take 1 tablet by mouth every 6 (six) hours as needed for moderate pain. 06/02/18   Raylene Everts, MD  ibuprofen (ADVIL,MOTRIN) 800 MG tablet Take 1 tablet (800 mg total) by mouth 3 (three) times daily. 06/02/18   Raylene Everts, MD    Family History Family History  Problem Relation Age of Onset  . Diabetes Father   . Lymphoma Father        Non Hodgkins Lymphoma    Social History Social History   Tobacco Use  . Smoking status: Current Every Day Smoker  . Smokeless tobacco: Never Used  Substance Use Topics  . Alcohol use: No    Comment: Occasionally   . Drug use: No     Allergies   Patient has no known allergies.   Review of  Systems Review of Systems  Constitutional: Negative for chills and fever.  HENT: Positive for dental problem. Negative for ear pain and sore throat.   Eyes: Negative for pain and visual disturbance.  Respiratory: Negative for cough and shortness of breath.   Cardiovascular: Negative for chest pain and palpitations.  Gastrointestinal: Negative for abdominal pain and vomiting.  Genitourinary: Negative for dysuria and hematuria.  Musculoskeletal: Negative for arthralgias and back pain.  Skin: Negative for color change and rash.  Neurological: Negative for seizures and syncope.  Psychiatric/Behavioral: Positive for sleep disturbance.  All other systems reviewed and are negative.    Physical Exam Triage Vital Signs ED Triage Vitals [06/02/18 1702]  Enc Vitals Group     BP (!) 162/106     Pulse Rate 78     Resp (!) 24     Temp 98.2 F (36.8 C)     Temp Source Oral     SpO2 99 %     Weight      Height      Head Circumference      Peak Flow      Pain Score 10     Pain Loc      Pain Edu?      Excl. in [place]?    No data  found.  Updated Vital Signs BP (!) 162/106 (BP Location: Left Arm)   Pulse 78   Temp 98.2 F (36.8 C) (Oral)   Resp (!) 24   SpO2 99%      Physical Exam Constitutional:      General: He is in acute distress.     Appearance: He is well-developed. He is obese.     Comments: Patient is holding his right side of his face and rocking back and forth on the exam table  HENT:     Head: Normocephalic and atraumatic.     Right Ear: Tympanic membrane, ear canal and external ear normal.     Left Ear: Tympanic membrane, ear canal and external ear normal.     Nose: Nose normal.     Mouth/Throat:     Mouth: Mucous membranes are moist.     Comments: Fractured molar posteriorly on the right with erythema of the surrounding gums Eyes:     Conjunctiva/sclera: Conjunctivae normal.     Pupils: Pupils are equal, round, and reactive to light.  Neck:     Musculoskeletal:  Normal range of motion.  Cardiovascular:     Rate and Rhythm: Normal rate.  Pulmonary:     Effort: Pulmonary effort is normal. No respiratory distress.  Abdominal:     General: There is no distension.     Palpations: Abdomen is soft.  Musculoskeletal: Normal range of motion.  Lymphadenopathy:     Cervical: Cervical adenopathy present.  Skin:    General: Skin is warm and dry.  Neurological:     Mental Status: He is alert.      UC Treatments / Results  Labs (all labs ordered are listed, but only abnormal results are displayed) Labs Reviewed - No data to display  EKG None  Radiology No results found.  Procedures Procedures (including critical care time)  Medications Ordered in UC Medications - No data to display  Initial Impression / Assessment and Plan / UC Course  I have reviewed the triage vital signs and the nursing notes.  Pertinent labs & imaging results that were available during my care of the patient were reviewed by me and considered in my medical decision making (see chart for details).     I told the patient that dental infections are quite painful.  We will give him antibiotics to treat.  He needs to follow-up with a dentist.  Because he stated the ibuprofen was not strong enough I will give him a stronger ibuprofen plus some Norco for the severe pain.  He is advised not to drive.  He knows this will not be refilled. Final Clinical Impressions(s) / UC Diagnoses   Final diagnoses:  Pain, dental  Dental infection     Discharge Instructions     You need to take the antibiotic 3 times a day You may take ibuprofen 3 times a day with food.  This is for moderate pain If you need a stronger pain pill in addition then take the Norco/hydrocodone every 4-6 hours Remember if you take the stronger pain medication, do not drive or operate machinery You need to call tomorrow to a dentist to make an appointment for sometime in the next week   ED Prescriptions     Medication Sig Dispense Auth. Provider   ibuprofen (ADVIL,MOTRIN) 800 MG tablet Take 1 tablet (800 mg total) by mouth 3 (three) times daily. 21 tablet Raylene Everts, MD   HYDROcodone-acetaminophen [facility]) 7.5-325 MG tablet Take 1  tablet by mouth every 6 (six) hours as needed for moderate pain. 15 tablet Raylene Everts, MD   amoxicillin (AMOXIL) 500 MG capsule Take 1 capsule (500 mg total) by mouth 3 (three) times daily. 21 capsule Raylene Everts, MD     Controlled Substance Prescriptions [place] Controlled Substance Registry consulted? Yes, I have consulted the [place] Controlled Substances Registry for this patient, and feel the risk/benefit ratio today is favorable for proceeding with this prescription for a controlled substance.   Raylene Everts, MD 06/02/18 2203

## 2018-06-02 NOTE — Discharge Instructions (Signed)
You need to take the antibiotic 3 times a day You may take ibuprofen 3 times a day with food.  This is for moderate pain If you need a stronger pain pill in addition then take the Norco/hydrocodone every 4-6 hours Remember if you take the stronger pain medication, do not drive or operate machinery You need to call tomorrow to a dentist to make an appointment for sometime in the next week

## 2018-06-02 NOTE — ED Triage Notes (Signed)
Pt has been suffering from lower dental pain x3 days.

## 2018-08-05 ENCOUNTER — Other Ambulatory Visit: Payer: Self-pay

## 2018-08-05 ENCOUNTER — Encounter (HOSPITAL_COMMUNITY): Payer: Self-pay | Admitting: Emergency Medicine

## 2018-08-05 ENCOUNTER — Ambulatory Visit (HOSPITAL_COMMUNITY)
Admission: EM | Admit: 2018-08-05 | Discharge: 2018-08-05 | Disposition: A | Discharge status: Home / Self Care | Payer: Self-pay | Attending: Family Medicine | Admitting: Family Medicine

## 2018-08-05 DIAGNOSIS — K0889 Other specified disorders of teeth and supporting structures: Secondary | ICD-10-CM

## 2018-08-05 MED ORDER — HYDROCODONE-ACETAMINOPHEN 5-325 MG PO TABS: 1.0000 | ORAL_TABLET | Freq: Four times a day (QID) | ORAL | 0 refills | Status: DC | PRN

## 2018-08-05 MED ORDER — IBUPROFEN 800 MG PO TABS: 800.0000 mg | ORAL_TABLET | Freq: Three times a day (TID) | ORAL | 0 refills | Status: DC

## 2018-08-05 MED ORDER — PENICILLIN V POTASSIUM 500 MG PO TABS: 500.0000 mg | ORAL_TABLET | Freq: Three times a day (TID) | ORAL | 0 refills | Status: DC

## 2018-08-05 NOTE — ED Triage Notes (Signed)
Patient presents to ucc for assessment of right lower dental pain x 3 days.  Patient states he chipped it.

## 2018-08-05 NOTE — Discharge Instructions (Addendum)
Be aware, pain medications may cause drowsiness. Please do not drive, operate heavy machinery or make important decisions while on this medication, it can cloud your judgement.  

## 2018-08-05 NOTE — ED Provider Notes (Signed)
[place]   [id number] 08/05/18 Arrival Time: 1528  ASSESSMENT & PLAN:  1. Pain, dental    No sign of abscess requiring I&D at this time. Discussed.  Meds ordered this encounter  Medications  . ibuprofen (ADVIL) 800 MG tablet    Sig: Take 1 tablet (800 mg total) by mouth 3 (three) times daily.    Dispense:  21 tablet    Refill:  0  . HYDROcodone-acetaminophen (NORCO/VICODIN) 5-325 MG tablet    Sig: Take 1 tablet by mouth every 6 (six) hours as needed for moderate pain or severe pain.    Dispense:  8 tablet    Refill:  0  . penicillin v potassium (VEETID) 500 MG tablet    Sig: Take 1 tablet (500 mg total) by mouth 3 (three) times daily.    Dispense:  30 tablet    Refill:  0   [place] Controlled Substances Registry consulted for this patient. I feel the risk/benefit ratio today is favorable for proceeding with this prescription for a controlled substance. Medication sedation precautions given.  Dental resource written instructions given. He will schedule dental evaluation as soon as possible if not improving over the next 24-48 hours.  Reviewed expectations re: course of current medical issues. Questions answered. Outlined signs and symptoms indicating need for more acute intervention. Patient verbalized understanding. After Visit Summary given.   SUBJECTIVE:  Todd Weiss is a 48 y.o. male who reports gradual onset of right lower molar dental pain described as aching/throbbing. Present for 2-3 days; same area as when seen here 05/2018. Responded to antibiotic. Fever: absent. Tolerating PO intake but reports pain with chewing. Normal swallowing. He does not see a dentist regularly. No neck swelling or pain. OTC analgesics without relief.  ROS: As per HPI. All other systems negative.   OBJECTIVE: Vitals:   08/05/18 1546  BP: (!) 153/102  Pulse: 89  Resp: 20  Temp: 98.3 F (36.8 C)  TempSrc: Oral  SpO2: 99%    General appearance: alert; no distress HENT:  normocephalic; atraumatic; dentition: fair; right lower rear gums without areas of fluctuance, drainage, or bleeding and with tenderness to palpation; fracture rear right lower molar; normal jaw movement without difficulty Neck: supple without LAD; FROM; trachea midline Lungs: normal respirations; unlabored Skin: warm and dry Psychological: alert and cooperative; normal mood and affect  No Known Allergies  Past Medical History:  Diagnosis Date  . Kidney calculi 10/2011   small kidney stone.  Stone analysis not performed  . Migraine   . Tobacco abuse    Social History   Socioeconomic History  . Marital status: Single    Spouse name: Not on file  . Number of children: Not on file  . Years of education: Not on file  . Highest education level: Not on file  Occupational History  . Not on file  Social Needs  . Financial resource strain: Not on file  . Food insecurity:    Worry: Not on file    Inability: Not on file  . Transportation needs:    Medical: Not on file    Non-medical: Not on file  Tobacco Use  . Smoking status: Current Every Day Smoker  . Smokeless tobacco: Never Used  Substance and Sexual Activity  . Alcohol use: No    Comment: Occasionally   . Drug use: No  . Sexual activity: Not on file  Lifestyle  . Physical activity:    Days per week: Not on file  Minutes per session: Not on file  . Stress: Not on file  Relationships  . Social connections:    Talks on phone: Not on file    Gets together: Not on file    Attends religious service: Not on file    Active member of club or organization: Not on file    Attends meetings of clubs or organizations: Not on file    Relationship status: Not on file  . Intimate partner violence:    Fear of current or ex partner: Not on file    Emotionally abused: Not on file    Physically abused: Not on file    Forced sexual activity: Not on file  Other Topics Concern  . Not on file  Social History Narrative  . Not on file    Family History  Problem Relation Age of Onset  . Diabetes Father   . Lymphoma Father        Non Hodgkins Lymphoma   History reviewed. No pertinent surgical history.   Vanessa Kick, MD 08/05/18 [phone]

## 2019-04-13 ENCOUNTER — Ambulatory Visit: Payer: Self-pay | Attending: Internal Medicine

## 2019-04-13 DIAGNOSIS — Z20822 Contact with and (suspected) exposure to covid-19: Secondary | ICD-10-CM | POA: Insufficient documentation

## 2019-04-14 LAB — NOVEL CORONAVIRUS, NAA: SARS-CoV-2, NAA: NOT DETECTED

## 2019-05-03 ENCOUNTER — Ambulatory Visit (HOSPITAL_COMMUNITY)
Admission: EM | Admit: 2019-05-03 | Discharge: 2019-05-03 | Disposition: A | Discharge status: Home / Self Care | Payer: Self-pay | Attending: Family Medicine | Admitting: Family Medicine

## 2019-05-03 ENCOUNTER — Other Ambulatory Visit: Payer: Self-pay

## 2019-05-03 ENCOUNTER — Encounter (HOSPITAL_COMMUNITY): Payer: Self-pay

## 2019-05-03 DIAGNOSIS — M545 Low back pain, unspecified: Secondary | ICD-10-CM

## 2019-05-03 MED ORDER — CYCLOBENZAPRINE HCL 5 MG PO TABS: 5.0000 mg | ORAL_TABLET | Freq: Three times a day (TID) | ORAL | 0 refills | Status: DC | PRN

## 2019-05-03 MED ORDER — METHYLPREDNISOLONE 4 MG PO TBPK: ORAL_TABLET | ORAL | 0 refills | Status: DC

## 2019-05-03 NOTE — Discharge Instructions (Signed)
Light and regular activity as tolerated.  See exercises provided.  Heat application while active can help with muscle spasms.  Sleep with pillow under your knees.   Complete course of steroids as provided.  Muscle relaxer as needed every 8 hours. May cause drowsiness. Please do not take if driving or drinking alcohol.  May be beneficial to take this before bed.  If symptoms worsen or do not improve in the next week to return to be seen or to follow up with your PCP.

## 2019-05-03 NOTE — ED Provider Notes (Signed)
[place]    CSN: [id number] Arrival date & time: 05/03/19  [id number]      History   Chief Complaint Chief Complaint  Patient presents with  . Back Pain    HPI Todd Weiss is a 49 y.o. male.   Todd Weiss presents with complaints of bilateral low back pain. Woke up with a few days ago. No known injury or trauma prior to onset. Worse with getting out of bed, position changes. Improves with activity. Pain with bending. Some radiation of pain to both thighs. Took ibuprofen this morning which didn't help. No numbness tingling or weakness, no saddle symptoms. He has been off of work recently due his mother in the hospital. Has had similar issues in the past which resolved after a few days with medications, seen here last in 2019 with similar.     ROS per HPI, negative if not otherwise mentioned.      Past Medical History:  Diagnosis Date  . Kidney calculi 10/2011   small kidney stone.  Stone analysis not performed  . Migraine   . Tobacco abuse     Patient Active Problem List   Diagnosis Date Noted  . Right foot pain 07/29/2012  . Low back pain 04/17/2012  . Prediabetes 02/03/2012  . Hyperlipidemia 02/03/2012  . 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 Tobacco abuse 12/18/2011  . RESTLESS LEG SYNDROME 07/21/2006  . ESSENTIAL HYPERTENSION 06/09/2006  . ASTHMA 06/09/2006    History reviewed. No pertinent surgical history.     Home Medications    Prior to Admission medications   Medication Sig Start Date End Date Taking? Authorizing Provider  cyclobenzaprine (FLEXERIL) 5 MG tablet Take 1 tablet (5 mg total) by mouth 3 (three) times daily as needed for muscle spasms. 05/03/19   Zigmund Gottron, NP  HYDROcodone-acetaminophen (NORCO/VICODIN) 5-325 MG tablet Take 1 tablet by mouth every 6 (six) hours as needed for moderate pain or severe pain. 08/05/18   Vanessa Kick, MD  ibuprofen (ADVIL) 800 MG tablet Take 1 tablet (800 mg total) by mouth 3 (three) times daily.  08/05/18   Vanessa Kick, MD  methylPREDNISolone (MEDROL DOSEPAK) 4 MG TBPK tablet Per box instructions 05/03/19   Augusto Gamble B, NP  penicillin v potassium (VEETID) 500 MG tablet Take 1 tablet (500 mg total) by mouth 3 (three) times daily. 08/05/18   Vanessa Kick, MD    Family History Family History  Problem Relation Age of Onset  . Diabetes Father   . Lymphoma Father        Non Hodgkins Lymphoma    Social History Social History   Tobacco Use  . Smoking status: Current Every Day Smoker  . Smokeless tobacco: Never Used  Substance Use Topics  . Alcohol use: No    Comment: Occasionally   . Drug use: No     Allergies   Patient has no known allergies.   Review of Systems Review of Systems   Physical Exam Triage Vital Signs ED Triage Vitals  Enc Vitals Group     BP 05/03/19 1006 (!) 149/92     Pulse Rate 05/03/19 1006 72     Resp 05/03/19 1006 18     Temp 05/03/19 1006 98.1 F (36.7 C)     Temp Source 05/03/19 1006 Oral     SpO2 05/03/19 1006 96 %     Weight --      Height --      Head Circumference --  Peak Flow --      Pain Score 05/03/19 1008 9     Pain Loc --      Pain Edu? --      Excl. in [place]? --    No data found.  Updated Vital Signs BP (!) 149/92 (BP Location: Left Arm)   Pulse 72   Temp 98.1 F (36.7 C) (Oral)   Resp 18   SpO2 96%   Visual Acuity Right Eye Distance:   Left Eye Distance:   Bilateral Distance:    Right Eye Near:   Left Eye Near:    Bilateral Near:     Physical Exam Constitutional:      Appearance: He is well-developed.  Cardiovascular:     Rate and Rhythm: Normal rate.  Pulmonary:     Effort: Pulmonary effort is normal.  Musculoskeletal:     Lumbar back: Tenderness and bony tenderness present. Normal range of motion. Negative right straight leg raise test and negative left straight leg raise test.     Comments: Tenderness across entire low back on palpation; no step off or deformity to spinous processes; strength  equal bilaterally; gross sensation intact; no pain with hip flexion; pain with bilateral straight leg raise without radiation of pain; ambulatory without difficulty although noted discomfort with first getting up from exam table  Skin:    General: Skin is warm and dry.  Neurological:     Mental Status: He is alert and oriented to person, place, and time.      UC Treatments / Results  Labs (all labs ordered are listed, but only abnormal results are displayed) Labs Reviewed - No data to display  EKG   Radiology No results found.  Procedures Procedures (including critical care time)  Medications Ordered in UC Medications - No data to display  Initial Impression / Assessment and Plan / UC Course  I have reviewed the triage vital signs and the nursing notes.  Pertinent labs & imaging results that were available during my care of the patient were reviewed by me and considered in my medical decision making (see chart for details).     Low back pain. No injury or trauma. No red flag findings. Medrol pack and muscle relaxers provided, this has  Been effective in the past. Return precautions provided. Patient verbalized understanding and agreeable to plan.   Ambulatory out of clinic without difficulty.   Final Clinical Impressions(s) / UC Diagnoses   Final diagnoses:  Acute bilateral low back pain, unspecified whether sciatica present     Discharge Instructions     Light and regular activity as tolerated.  See exercises provided.  Heat application while active can help with muscle spasms.  Sleep with pillow under your knees.   Complete course of steroids as provided.  Muscle relaxer as needed every 8 hours. May cause drowsiness. Please do not take if driving or drinking alcohol.  May be beneficial to take this before bed.  If symptoms worsen or do not improve in the next week to return to be seen or to follow up with your PCP.     ED Prescriptions    Medication Sig Dispense  Auth. Provider   methylPREDNISolone (MEDROL DOSEPAK) 4 MG TBPK tablet Per box instructions 21 tablet Lorenda Grecco B, NP   cyclobenzaprine (FLEXERIL) 5 MG tablet Take 1 tablet (5 mg total) by mouth 3 (three) times daily as needed for muscle spasms. 15 tablet Zigmund Gottron, NP     PDMP  not reviewed this encounter.   Zigmund Gottron, NP 05/03/19 1112

## 2019-05-03 NOTE — ED Triage Notes (Signed)
Pt presents with ongoing lower back pain not related to any injury or lifting.

## 2022-04-07 ENCOUNTER — Encounter (HOSPITAL_COMMUNITY): Payer: Self-pay | Admitting: *Deleted

## 2022-04-07 ENCOUNTER — Ambulatory Visit (HOSPITAL_COMMUNITY)
Admission: EM | Admit: 2022-04-07 | Discharge: 2022-04-07 | Disposition: A | Discharge status: Home / Self Care | Payer: PRIVATE HEALTH INSURANCE | Attending: Emergency Medicine | Admitting: Emergency Medicine

## 2022-04-07 ENCOUNTER — Other Ambulatory Visit: Payer: Self-pay

## 2022-04-07 DIAGNOSIS — M62132 Other rupture of muscle (nontraumatic), left forearm: Secondary | ICD-10-CM

## 2022-04-07 MED ORDER — NAPROXEN 500 MG PO TABS: 500.0000 mg | ORAL_TABLET | Freq: Two times a day (BID) | ORAL | 0 refills | Status: AC

## 2022-04-07 MED ORDER — HYDROCODONE-ACETAMINOPHEN 5-325 MG PO TABS: 1.0000 | ORAL_TABLET | Freq: Four times a day (QID) | ORAL | 0 refills | Status: AC | PRN

## 2022-04-07 NOTE — ED Triage Notes (Signed)
Pt reports he injured his arm at work 3 days ago. Pt was pushing a box at work 3 days ago and he felt his muscle pop at the LT elbow.

## 2022-04-07 NOTE — Discharge Instructions (Signed)
Believe you have ruptured a tendon or muscle in your forearm.  I talked to Dr. Erlinda Hong, hand surgeon on-call, and he would like to see you this week.  Call the office and tell them that you are to be seen this week.  In the meantime, take Naprosyn with a Tylenol containing product twice a day.  Either Naprosyn with 1000 mg of Tylenol for mild to moderate pain or Naprosyn with 1-2 Norco for severe pain.  You may take an additional Tylenol containing product 1 more time a day.  Do not take Norco and Tylenol together, as they both have Tylenol in them, and too much can hurt your liver.  Wear the wrist splint for comfort.

## 2022-04-07 NOTE — ED Provider Notes (Signed)
HPI  SUBJECTIVE:  Todd Weiss is a left handed 52 y.o. male who presents with painful, swollen knot in the volar aspect of his left forearm with medial elbow bruising starting 3 days ago.  States that he was pushing a box at work, felt a "pop" followed by pain.  Denies direct trauma to the forearm or elbow, no hand weakness, but states that his hand "feels heavy".  He states that he cannot lift anything heavy.  He has tried Biofreeze and ibuprofen.  The Biofreeze helps.  Symptoms worse with trying to lift heavy objects, ulnar/radial deviation.  He has a past medical history of hypertension, hypercholesterolemia.  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    Past Medical History:  Diagnosis Date   Kidney calculi 10/2011   small kidney stone.  Stone analysis not performed   Migraine    Tobacco abuse     History reviewed. No pertinent surgical history.  Family History  Problem Relation Age of Onset   Diabetes Father    Lymphoma Father        Non Hodgkins Lymphoma    Social History   Tobacco Use   Smoking status: Every Day   Smokeless tobacco: Never  Substance Use Topics   Alcohol use: No    Comment: Occasionally    Drug use: No    No current facility-administered medications for this encounter.  Current Outpatient Medications:    HYDROcodone-acetaminophen (NORCO/VICODIN) 5-325 MG tablet, Take 1-2 tablets by mouth every 6 (six) hours as needed for moderate pain or severe pain., Disp: 12 tablet, Rfl: 0   naproxen (NAPROSYN) 500 MG tablet, Take 1 tablet (500 mg total) by mouth 2 (two) times daily., Disp: 20 tablet, Rfl: 0  No Known Allergies   ROS  As noted in HPI.   Physical Exam  BP (!) 138/92   Pulse 80   Temp 98 F (36.7 C)   Resp 18   SpO2 97%   Constitutional: Well developed, well nourished, no acute distress Eyes:  EOMI, conjunctiva normal bilaterally HENT: Normocephalic, atraumatic,mucus membranes moist Respiratory: Normal inspiratory  effort Cardiovascular: Normal rate GI: nondistended skin: No rash, skin intact Musculoskeletal: Positive tender swelling middle proximal volar aspect of left forearm.  Positive bruising inferior to the medial epicondyle.  Pain with radial/ulnar deviation.  No pain with flexion/extension.  Strength intact.  RP 2+.  Sensation, motor intact in median/radial/ulnar distribution.  No other elbow tenderness.     Neurologic: Alert & oriented x 3, no focal neuro deficits Psychiatric: Speech and behavior appropriate   ED Course   Medications - No data to display  Orders Placed This Encounter  Procedures   Apply Wrist brace    Standing Status:   Standing    Number of Occurrences:   1    Order Specific Question:   Laterality    Answer:   Left    No results found for this or any previous visit (from the past 24 hour(s)). No results found.  ED Clinical Impression  1. Other rupture of muscle (nontraumatic), left forearm      ED Assessment/Plan     Concern for ruptured forearm flexor tendon/muscle.  Deferring imaging as I do not think that he has a fracture.  [place] Narcotic database reviewed for this patient, and feel that the risk/benefit ratio today is favorable for proceeding with a prescription for controlled substance.  No opiate prescriptions in the past 2 years.  Discussed case with Dr. Erlinda Hong, hand  on-call.  He will see the patient this week.  Patient is to call his office and they will work him in.  Agrees with plan of putting him in a wrist splint.  Will send home with Naprosyn/Tylenol containing product twice a day.  Either Naprosyn/Tylenol or Naprosyn/Norco.  May take an additional Tylenol containing product 1 more time a day.  Work note for 3 days.  Discussed MDM, treatment plan, and plan for follow-up with patient.. patient agrees with plan.   Meds ordered this encounter  Medications   naproxen (NAPROSYN) 500 MG tablet    Sig: Take 1 tablet (500 mg total) by mouth 2 (two)  times daily.    Dispense:  20 tablet    Refill:  0   HYDROcodone-acetaminophen (NORCO/VICODIN) 5-325 MG tablet    Sig: Take 1-2 tablets by mouth every 6 (six) hours as needed for moderate pain or severe pain.    Dispense:  12 tablet    Refill:  0      *This clinic note was created using [employer]. Therefore, there may be occasional mistakes despite careful proofreading.  ?    Melynda Ripple, MD 04/08/22 1704

## 2022-04-17 ENCOUNTER — Ambulatory Visit (INDEPENDENT_AMBULATORY_CARE_PROVIDER_SITE_OTHER): Payer: PRIVATE HEALTH INSURANCE

## 2022-04-17 ENCOUNTER — Ambulatory Visit (INDEPENDENT_AMBULATORY_CARE_PROVIDER_SITE_OTHER): Payer: PRIVATE HEALTH INSURANCE | Admitting: Orthopaedic Surgery

## 2022-04-17 DIAGNOSIS — M79632 Pain in left forearm: Secondary | ICD-10-CM

## 2022-04-17 NOTE — Progress Notes (Signed)
Office Visit Note   Patient: Todd Weiss           Date of Birth: September 07, 1970           MRN: [id number] Visit Date: 04/17/2022              Requested by: No referring provider defined for this encounter. PCP: Patient, No Pcp Per   Assessment & Plan: Visit Diagnoses:  1. Left forearm pain     Plan: Impression is questionable distal biceps rupture versus pronator mass strain.  Will obtain MRI ASAP to better define the injury.  Follow-up after the MRI.  Follow-Up Instructions: No follow-ups on file.   Orders:  Orders Placed This Encounter  Procedures   XR Forearm Left   MR Elbow Left w/o contrast   No orders of the defined types were placed in this encounter.     Procedures: No procedures performed   Clinical Data: No additional findings.   Subjective: Chief Complaint  Patient presents with   Left Forearm - Pain    HPI Todd Weiss is a very pleasant 52 year old gentleman here to be evaluated for forearm pain in the left arm for about a week.  He was pushing a box at work when he felt a pop in his arm.  Initially had bruising in the elbow and forearm region.  He has felt some improvement.  He drives a forklift. Review of Systems  Constitutional: Negative.   HENT: Negative.    Eyes: Negative.   Respiratory: Negative.    Cardiovascular: Negative.   Gastrointestinal: Negative.   Endocrine: Negative.   Genitourinary: Negative.   Skin: Negative.   Allergic/Immunologic: Negative.   Neurological: Negative.   Hematological: Negative.   Psychiatric/Behavioral: Negative.    All other systems reviewed and are negative.    Objective: Vital Signs: There were no vitals taken for this visit.  Physical Exam Vitals and nursing note reviewed.  Constitutional:      Appearance: He is well-developed.  HENT:     Head: Normocephalic and atraumatic.  Eyes:     Pupils: Pupils are equal, round, and reactive to light.  Pulmonary:     Effort: Pulmonary effort is  normal.  Abdominal:     Palpations: Abdomen is soft.  Musculoskeletal:        General: Normal range of motion.     Cervical back: Neck supple.  Skin:    General: Skin is warm.  Neurological:     Mental Status: He is alert and oriented to person, place, and time.  Psychiatric:        Behavior: Behavior normal.        Thought Content: Thought content normal.        Judgment: Judgment normal.     Ortho Exam Exam nation of left elbow shows soreness and tenderness to the antecubital fossa.  Equivocal hook test.  He has pain with with resisted forearm supination and elbow flexion and forearm pronation. Specialty Comments:  No specialty comments available.  Imaging: XR Forearm Left  Result Date: 04/17/2022 AP and lateral x-rays show no acute or structural abnormalities    PMFS History: Patient Active Problem List   Diagnosis Date Noted   Right foot pain 07/29/2012   Low back pain 04/17/2012   Prediabetes 02/03/2012   Hyperlipidemia 02/03/2012   Atypical moles 12/18/2011   Tobacco abuse 12/18/2011   RESTLESS LEG SYNDROME 07/21/2006   ESSENTIAL HYPERTENSION 06/09/2006   ASTHMA 06/09/2006   Past Medical  History:  Diagnosis Date   Kidney calculi 10/2011   small kidney stone.  Stone analysis not performed   Migraine    Tobacco abuse     Family History  Problem Relation Age of Onset   Diabetes Father    Lymphoma Father        Non Hodgkins Lymphoma    No past surgical history on file. Social History   Occupational History   Not on file  Tobacco Use   Smoking status: Every Day   Smokeless tobacco: Never  Substance and Sexual Activity   Alcohol use: No    Comment: Occasionally    Drug use: No   Sexual activity: Not on file

## 2022-05-01 ENCOUNTER — Encounter: Payer: Self-pay | Admitting: *Deleted
